# Patient Record
Sex: Female | Born: 2000 | Hispanic: No | Marital: Single | Smoking: Former smoker
Health system: Southern US, Community
[De-identification: ages and names within clinical notes are randomized; demographics above are authoritative.]

## PROBLEM LIST (undated history)

## (undated) ENCOUNTER — Inpatient Hospital Stay (HOSPITAL_COMMUNITY): Payer: Self-pay

---
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
## 2018-01-02 ENCOUNTER — Encounter (HOSPITAL_COMMUNITY): Payer: Self-pay | Admitting: Emergency Medicine

## 2018-01-02 ENCOUNTER — Ambulatory Visit (HOSPITAL_COMMUNITY)
Admission: EM | Admit: 2018-01-02 | Discharge: 2018-01-02 | Disposition: A | Discharge status: Home / Self Care | Payer: Medicaid Other | Attending: Internal Medicine | Admitting: Internal Medicine

## 2018-01-02 ENCOUNTER — Other Ambulatory Visit: Payer: Self-pay

## 2018-01-02 ENCOUNTER — Telehealth (HOSPITAL_COMMUNITY): Payer: Self-pay

## 2018-01-02 DIAGNOSIS — Z3202 Encounter for pregnancy test, result negative: Secondary | ICD-10-CM | POA: Diagnosis not present

## 2018-01-02 DIAGNOSIS — R1110 Vomiting, unspecified: Secondary | ICD-10-CM

## 2018-01-02 DIAGNOSIS — R1013 Epigastric pain: Secondary | ICD-10-CM | POA: Diagnosis not present

## 2018-01-02 DIAGNOSIS — R0789 Other chest pain: Secondary | ICD-10-CM | POA: Diagnosis not present

## 2018-01-02 LAB — COMPREHENSIVE METABOLIC PANEL
ALT: 226 U/L — ABNORMAL HIGH (ref 14–54)
AST: 220 U/L — AB (ref 15–41)
Albumin: 4.2 g/dL (ref 3.5–5.0)
Alkaline Phosphatase: 107 U/L (ref 38–126)
Anion gap: 9 (ref 5–15)
BILIRUBIN TOTAL: 1.1 mg/dL (ref 0.3–1.2)
BUN: 13 mg/dL (ref 6–20)
CO2: 30 mmol/L (ref 22–32)
Calcium: 9.8 mg/dL (ref 8.9–10.3)
Chloride: 104 mmol/L (ref 101–111)
Creatinine, Ser: 0.65 mg/dL (ref 0.44–1.00)
Glucose, Bld: 116 mg/dL — ABNORMAL HIGH (ref 65–99)
POTASSIUM: 3.7 mmol/L (ref 3.5–5.1)
Sodium: 143 mmol/L (ref 135–145)
TOTAL PROTEIN: 7.8 g/dL (ref 6.5–8.1)

## 2018-01-02 LAB — POCT URINALYSIS DIP (DEVICE)
Glucose, UA: NEGATIVE mg/dL
KETONES UR: 40 mg/dL — AB
Nitrite: NEGATIVE
PH: 5.5 (ref 5.0–8.0)
PROTEIN: 30 mg/dL — AB
Specific Gravity, Urine: 1.025 (ref 1.005–1.030)
UROBILINOGEN UA: 0.2 mg/dL (ref 0.0–1.0)

## 2018-01-02 LAB — CBC
HCT: 42.8 % (ref 36.0–46.0)
Hemoglobin: 13.9 g/dL (ref 12.0–15.0)
MCH: 29.6 pg (ref 26.0–34.0)
MCHC: 32.5 g/dL (ref 30.0–36.0)
MCV: 91.1 fL (ref 78.0–100.0)
PLATELETS: 313 10*3/uL (ref 150–400)
RBC: 4.7 MIL/uL (ref 3.87–5.11)
RDW: 14.2 % (ref 11.5–15.5)
WBC: 10.4 10*3/uL (ref 4.0–10.5)

## 2018-01-02 LAB — POCT PREGNANCY, URINE: Preg Test, Ur: NEGATIVE

## 2018-01-02 LAB — LIPASE, BLOOD: LIPASE: 29 U/L (ref 11–51)

## 2018-01-02 MED ORDER — SUCRALFATE 1 G PO TABS: 1.0000 g | ORAL_TABLET | Freq: Three times a day (TID) | ORAL | 0 refills | Status: DC

## 2018-01-02 MED ORDER — ONDANSETRON 4 MG PO TBDP: 4.0000 mg | ORAL_TABLET | Freq: Three times a day (TID) | ORAL | 0 refills | Status: DC | PRN

## 2018-01-02 MED ORDER — ONDANSETRON 4 MG PO TBDP
ORAL_TABLET | ORAL | Status: AC
  Filled 2018-01-02: qty 1

## 2018-01-02 MED ORDER — ONDANSETRON 4 MG PO TBDP
4.0000 mg | ORAL_TABLET | Freq: Once | ORAL | Status: AC
  Administered 2018-01-02: 4 mg via ORAL

## 2018-01-02 MED ORDER — OMEPRAZOLE 20 MG PO CPDR: 20.0000 mg | DELAYED_RELEASE_CAPSULE | Freq: Every day | ORAL | 0 refills | Status: DC

## 2018-01-02 MED ORDER — GI COCKTAIL ~~LOC~~
ORAL | Status: AC
  Filled 2018-01-02: qty 30

## 2018-01-02 MED ORDER — GI COCKTAIL ~~LOC~~
30.0000 mL | Freq: Once | ORAL | Status: AC
  Administered 2018-01-02: 30 mL via ORAL

## 2018-01-02 NOTE — Discharge Instructions (Signed)
Please use Zofran as needed for nausea and vomiting, please begin taking every 6-8 hours, then good as needed as symptoms improving.  Please begin diet with liquids and then transition to a blander foods like toast, crackers, rice, soups, potatoes.  Then transition to normal diet as things continue to improve.  Please continue to take Zantac, may try Prilosec as an alternative, please take either of these consistently for the next 2 weeks  Please use Carafate before meals and bedtime to also help with symptoms  We do your blood to check your liver, gallbladder, pancreas and signs of infection as causes of your symptoms.  We will call you if any of these come back abnormal.  Please drink plenty of fluids  Follow-up if abdominal pain worsening, persistent nausea vomiting, worsening chest pain or shortness of breath, fever.

## 2018-01-02 NOTE — Telephone Encounter (Signed)
Attempted to reach patient x 2 regarding results. No answer at this time. Voicemail left.  [facility] patient needs to go to the Emergency Room for further evaluation.

## 2018-01-02 NOTE — Telephone Encounter (Signed)
Attempted to reach patient regarding normal results. No answer at this time.  

## 2018-01-02 NOTE — ED Triage Notes (Signed)
Center, epigastric pain, radiates upward into chest.  Pain is sharp.  Pain started Sunday.  Patient vomited 4 times today

## 2018-01-02 NOTE — ED Provider Notes (Signed)
[facility]    CSN: [id number] Arrival date & time: 01/02/18  1358     History   Chief Complaint Chief Complaint  Patient presents with  . Abdominal Pain    HPI Jennifer Hunter is a 17 y.o. female presenting today for evaluation of epigastric pain.  Patient states that for the past 2 days she has had epigastric pain associated with vomiting.  Vomiting 4 times a day.  Pain is sharp in the center, does go up into her chest.  Denies changes in bowels or diarrhea.  Denies hematemesis.  Does endorse some mild shortness of breath as well.  The patient denies shortness of breath or URI symptoms of cough, congestion or sore throat.  Denies fevers.  Patient is currently on her menstrual cycle.  Denies dysuria, increased frequency, vaginal discharge or pelvic pain.  Patient states that the last time she had a similar sensation was when she was pregnant having reflux.  But it did not last as persistently.  She has been taking Zantac twice a day since Sunday without relief as well as trying Alka-Seltzer.  HPI  History reviewed. No pertinent past medical history.  Patient Active Problem List   Diagnosis Date Noted  . Vaginal delivery 03/06/2016    History reviewed. No pertinent surgical history.  OB History    Gravida  2   Para  1   Term  1   Preterm  0   AB  0   Living  1     SAB  0   TAB  0   Ectopic  0   Multiple      Live Births  1            Home Medications    Prior to Admission medications   Medication Sig Start Date End Date Taking? Authorizing Provider  acetaminophen (TYLENOL) 325 MG tablet Take 650 mg by mouth every 6 (six) hours as needed.    [provider]  benzonatate (TESSALON) 100 MG capsule Take 1-2 capsules (100-200 mg total) by mouth 3 (three) times daily as needed for cough. 11/01/17   Elvina SidleLauenstein, Kurt, MD  cetirizine-pseudoephedrine (ZYRTEC-D) 5-120 MG tablet Take 1 tablet by mouth daily. 11/01/17   Elvina SidleLauenstein, Kurt,  MD  diphenhydrAMINE (BENADRYL) 25 mg capsule Take 1 capsule (25 mg total) by mouth every 6 (six) hours as needed. Patient taking differently: Take 25 mg by mouth every 6 (six) hours as needed for itching or allergies.  01/28/16   Aviva SignsWilliams, Marie L, CNM  omeprazole (PRILOSEC) 20 MG capsule Take 1 capsule (20 mg total) by mouth daily. 01/02/18   Nasser Ku C, PA-C  ondansetron (ZOFRAN ODT) 4 MG disintegrating tablet Take 1 tablet (4 mg total) by mouth every 8 (eight) hours as needed for nausea or vomiting. 01/02/18   Cloteal Isaacson C, PA-C  PRESCRIPTION MEDICATION Take 1 tablet by mouth daily as needed (sleep and depression).    [provider]  sucralfate (CARAFATE) 1 g tablet Take 1 tablet (1 g total) by mouth 4 (four) times daily -  with meals and at bedtime for 14 days. 01/02/18 01/16/18  Zayvion Stailey, Junius CreamerHallie C, PA-C    Family History Family History  Problem Relation Age of Onset  . Hypertension Mother     Social History Social History   Tobacco Use  . Smoking status: Former Games developermoker  . Smokeless tobacco: Former Engineer, waterUser  Substance Use Topics  . Alcohol use: No  . Drug use: No  Allergies   Patient has no known allergies.   Review of Systems Review of Systems  Constitutional: Negative for chills, fatigue and fever.  HENT: Negative for congestion, ear pain, rhinorrhea, sinus pressure, sore throat and trouble swallowing.   Respiratory: Positive for shortness of breath. Negative for cough and chest tightness.   Cardiovascular: Positive for chest pain.  Gastrointestinal: Positive for abdominal pain, nausea and vomiting. Negative for diarrhea.  Genitourinary: Positive for vaginal bleeding. Negative for dysuria, flank pain, vaginal discharge and vaginal pain.  Musculoskeletal: Negative for back pain and myalgias.  Skin: Negative for rash.  Neurological: Negative for dizziness, light-headedness and headaches.     Physical Exam Triage Vital Signs ED Triage Vitals  Enc Vitals  Group     BP 01/02/18 1420 138/87     Pulse Rate 01/02/18 1420 65     Resp 01/02/18 1420 18     Temp 01/02/18 1420 98.2 F (36.8 C)     Temp Source 01/02/18 1420 Oral     SpO2 01/02/18 1420 98 %     Weight --      Height --      Head Circumference --      Peak Flow --      Pain Score 01/02/18 1418 10     Pain Loc --      Pain Edu? --      Excl. in GC? --    No data found.  Updated Vital Signs BP 138/87 (BP Location: Right Arm)   Pulse 65   Temp 98.2 F (36.8 C) (Oral)   Resp 18   SpO2 98%   Visual Acuity Right Eye Distance:   Left Eye Distance:   Bilateral Distance:    Right Eye Near:   Left Eye Near:    Bilateral Near:     Physical Exam  Constitutional: She is oriented to person, place, and time. She appears well-developed and well-nourished. No distress.  HENT:  Head: Normocephalic and atraumatic.  Mouth/Throat: Oropharynx is clear and moist.  Eyes: Pupils are equal, round, and reactive to light. Conjunctivae and EOM are normal.  Neck: Neck supple.  Cardiovascular: Normal rate and regular rhythm.  No murmur heard. Tenderness to palpation over inferior sternum  Pulmonary/Chest: Effort normal and breath sounds normal. No respiratory distress.  Abdominal: Soft. There is tenderness.  Tenderness to bilateral upper quadrants, and epigastrium.  Negative rebound, negative Murphy's, negative Rovsing's, negative McBurney's.  Musculoskeletal: She exhibits no edema.  Comfortably moving around to various lying, sitting positions  Neurological: She is alert and oriented to person, place, and time.  Skin: Skin is warm and dry.  Psychiatric: She has a normal mood and affect.  Nursing note and vitals reviewed.    UC Treatments / Results  Labs (all labs ordered are listed, but only abnormal results are displayed) Labs Reviewed  POCT URINALYSIS DIP (DEVICE) - Abnormal; Notable for the following components:      Result Value   Bilirubin Urine SMALL (*)    Ketones, ur 40  (*)    Hgb urine dipstick LARGE (*)    Protein, ur 30 (*)    Leukocytes, UA TRACE (*)    All other components within normal limits  CBC  COMPREHENSIVE METABOLIC PANEL  LIPASE, BLOOD  POCT PREGNANCY, URINE    EKG None  Radiology No results found.  Procedures Procedures (including critical care time)  Medications Ordered in UC Medications  ondansetron (ZOFRAN-ODT) disintegrating tablet 4 mg (4 mg Oral Given  01/02/18 1509)  gi cocktail (Maalox,Lidocaine,Donnatal) (30 mLs Oral Given 01/02/18 1510)    Initial Impression / Assessment and Plan / UC Course  I have reviewed the triage vital signs and the nursing notes.  Pertinent labs & imaging results that were available during my care of the patient were reviewed by me and considered in my medical decision making (see chart for details).     Pregnancy test negative, UA revealing signs of dehydration.  Provided patient with Zofran and GI cocktail.  She did not have any relief of abdominal pain with this.  EKG normal sinus rhythm, patient does have T wave inversions in V1 and V2-previously present in V1, but symptoms seem more GI related.  Will have patient continue to take Zantac, offered to try Prilosec as an alternative.  Advised to take consistently for 2 weeks.  Adding Carafate as pain worsens after eating.  Zofran as needed for nausea and vomiting as possible viral cause as well.  Lab work obtained including CMP, CBC and lipase to check for other intra-abdominal causes of abdominal pain.  Will call with results if abnormal.Discussed strict return precautions. Patient verbalized understanding and is agreeable with plan.  Final Clinical Impressions(s) / UC Diagnoses   Final diagnoses:  Epigastric pain     Discharge Instructions     Please use Zofran as needed for nausea and vomiting, please begin taking every 6-8 hours, then good as needed as symptoms improving.  Please begin diet with liquids and then transition to a blander  foods like toast, crackers, rice, soups, potatoes.  Then transition to normal diet as things continue to improve.  Please continue to take Zantac, may try Prilosec as an alternative, please take either of these consistently for the next 2 weeks  Please use Carafate before meals and bedtime to also help with symptoms  We do your blood to check your liver, gallbladder, pancreas and signs of infection as causes of your symptoms.  We will call you if any of these come back abnormal.  Please drink plenty of fluids  Follow-up if abdominal pain worsening, persistent nausea vomiting, worsening chest pain or shortness of breath, fever.    ED Prescriptions    Medication Sig Dispense Auth. Provider   sucralfate (CARAFATE) 1 g tablet Take 1 tablet (1 g total) by mouth 4 (four) times daily -  with meals and at bedtime for 14 days. 56 tablet Jordie Skalsky C, PA-C   omeprazole (PRILOSEC) 20 MG capsule Take 1 capsule (20 mg total) by mouth daily. 30 capsule Gissela Bloch C, PA-C   ondansetron (ZOFRAN ODT) 4 MG disintegrating tablet Take 1 tablet (4 mg total) by mouth every 8 (eight) hours as needed for nausea or vomiting. 20 tablet Ayson Cherubini, [place] C, PA-C     Controlled Substance Prescriptions [place] Controlled Substance Registry consulted? Not Applicable   Lew Dawes, [place] 01/02/18 1550

## 2018-01-03 ENCOUNTER — Telehealth (HOSPITAL_COMMUNITY): Payer: Self-pay

## 2018-01-03 NOTE — Telephone Encounter (Signed)
Pt called back. Aware of results and need to go to the ER for further evaluation.

## 2018-01-04 ENCOUNTER — Emergency Department (HOSPITAL_COMMUNITY): Payer: Medicaid Other

## 2018-01-04 ENCOUNTER — Encounter (HOSPITAL_COMMUNITY): Payer: Self-pay | Admitting: Emergency Medicine

## 2018-01-04 ENCOUNTER — Inpatient Hospital Stay (HOSPITAL_COMMUNITY)
Admission: EM | Admit: 2018-01-04 | Discharge: 2018-01-07 | DRG: 419 | Disposition: A | Discharge status: Home / Self Care | Payer: Medicaid Other | Attending: Internal Medicine | Admitting: Internal Medicine

## 2018-01-04 ENCOUNTER — Other Ambulatory Visit: Payer: Self-pay

## 2018-01-04 ENCOUNTER — Inpatient Hospital Stay (HOSPITAL_COMMUNITY): Payer: Medicaid Other

## 2018-01-04 DIAGNOSIS — Z87891 Personal history of nicotine dependence: Secondary | ICD-10-CM | POA: Diagnosis not present

## 2018-01-04 DIAGNOSIS — R739 Hyperglycemia, unspecified: Secondary | ICD-10-CM | POA: Diagnosis present

## 2018-01-04 DIAGNOSIS — K8066 Calculus of gallbladder and bile duct with acute and chronic cholecystitis without obstruction: Secondary | ICD-10-CM | POA: Diagnosis present

## 2018-01-04 DIAGNOSIS — R945 Abnormal results of liver function studies: Secondary | ICD-10-CM

## 2018-01-04 DIAGNOSIS — K812 Acute cholecystitis with chronic cholecystitis: Secondary | ICD-10-CM | POA: Diagnosis not present

## 2018-01-04 DIAGNOSIS — Z79899 Other long term (current) drug therapy: Secondary | ICD-10-CM

## 2018-01-04 DIAGNOSIS — K8050 Calculus of bile duct without cholangitis or cholecystitis without obstruction: Secondary | ICD-10-CM

## 2018-01-04 DIAGNOSIS — R7989 Other specified abnormal findings of blood chemistry: Secondary | ICD-10-CM

## 2018-01-04 DIAGNOSIS — R748 Abnormal levels of other serum enzymes: Secondary | ICD-10-CM | POA: Diagnosis present

## 2018-01-04 DIAGNOSIS — D649 Anemia, unspecified: Secondary | ICD-10-CM | POA: Diagnosis present

## 2018-01-04 LAB — COMPREHENSIVE METABOLIC PANEL
ALBUMIN: 4.4 g/dL (ref 3.5–5.0)
ALK PHOS: 138 U/L — AB (ref 38–126)
ALT: 470 U/L — AB (ref 14–54)
AST: 185 U/L — AB (ref 15–41)
Anion gap: 5 (ref 5–15)
BUN: 9 mg/dL (ref 6–20)
CALCIUM: 9.3 mg/dL (ref 8.9–10.3)
CHLORIDE: 104 mmol/L (ref 101–111)
CO2: 31 mmol/L (ref 22–32)
CREATININE: 0.58 mg/dL (ref 0.44–1.00)
GFR calc non Af Amer: >60 mL/min (ref >60)
GLUCOSE: 106 mg/dL — AB (ref 65–99)
Potassium: 3.8 mmol/L (ref 3.5–5.1)
SODIUM: 140 mmol/L (ref 135–145)
Total Bilirubin: 0.8 mg/dL (ref 0.3–1.2)
Total Protein: 7.7 g/dL (ref 6.5–8.1)

## 2018-01-04 LAB — URINALYSIS, ROUTINE W REFLEX MICROSCOPIC
Bilirubin Urine: NEGATIVE
GLUCOSE, UA: NEGATIVE mg/dL
Ketones, ur: NEGATIVE mg/dL
Leukocytes, UA: NEGATIVE
Nitrite: NEGATIVE
PROTEIN: NEGATIVE mg/dL
SPECIFIC GRAVITY, URINE: 1.006 (ref 1.005–1.030)
pH: 9 — ABNORMAL HIGH (ref 5.0–8.0)

## 2018-01-04 LAB — CBC
HCT: 40.3 % (ref 36.0–46.0)
Hemoglobin: 13.4 g/dL (ref 12.0–15.0)
MCH: 30.6 pg (ref 26.0–34.0)
MCHC: 33.3 g/dL (ref 30.0–36.0)
MCV: 92 fL (ref 78.0–100.0)
PLATELETS: 289 10*3/uL (ref 150–400)
RBC: 4.38 MIL/uL (ref 3.87–5.11)
RDW: 14.6 % (ref 11.5–15.5)
WBC: 5.3 10*3/uL (ref 4.0–10.5)

## 2018-01-04 LAB — I-STAT BETA HCG BLOOD, ED (MC, WL, AP ONLY): I-stat hCG, quantitative: <5 m[IU]/mL (ref <5)

## 2018-01-04 LAB — LIPASE, BLOOD: LIPASE: 29 U/L (ref 11–51)

## 2018-01-04 MED ORDER — OXYCODONE HCL 5 MG PO TABS
5.0000 mg | ORAL_TABLET | ORAL | Status: DC | PRN
  Administered 2018-01-06 – 2018-01-07 (×5): 5 mg via ORAL
  Filled 2018-01-04 (×5): qty 1

## 2018-01-04 MED ORDER — SODIUM CHLORIDE 0.9 % IV BOLUS
1000.0000 mL | Freq: Once | INTRAVENOUS | Status: AC
  Administered 2018-01-04: 1000 mL via INTRAVENOUS

## 2018-01-04 MED ORDER — SODIUM CHLORIDE 0.9 % IV SOLN
INTRAVENOUS | Status: DC
  Administered 2018-01-04 – 2018-01-07 (×4): via INTRAVENOUS

## 2018-01-04 MED ORDER — GADOBENATE DIMEGLUMINE 529 MG/ML IV SOLN
15.0000 mL | Freq: Once | INTRAVENOUS | Status: AC | PRN
  Administered 2018-01-04: 12 mL via INTRAVENOUS

## 2018-01-04 MED ORDER — ACETAMINOPHEN 650 MG RE SUPP: 650.0000 mg | Freq: Four times a day (QID) | RECTAL | Status: DC | PRN

## 2018-01-04 MED ORDER — ONDANSETRON HCL 4 MG PO TABS: 4.0000 mg | ORAL_TABLET | Freq: Four times a day (QID) | ORAL | Status: DC | PRN

## 2018-01-04 MED ORDER — ACETAMINOPHEN 325 MG PO TABS: 650.0000 mg | ORAL_TABLET | Freq: Four times a day (QID) | ORAL | Status: DC | PRN

## 2018-01-04 MED ORDER — ENOXAPARIN SODIUM 40 MG/0.4ML ~~LOC~~ SOLN
40.0000 mg | SUBCUTANEOUS | Status: DC
  Administered 2018-01-04: 40 mg via SUBCUTANEOUS
  Filled 2018-01-04 (×3): qty 0.4

## 2018-01-04 MED ORDER — ONDANSETRON HCL 4 MG/2ML IJ SOLN: 4.0000 mg | Freq: Four times a day (QID) | INTRAMUSCULAR | Status: DC | PRN

## 2018-01-04 NOTE — ED Notes (Signed)
ED TO INPATIENT HANDOFF REPORT  Name/Age/Gender Jennifer Hunter 17 y.o. female  Code Status Code Status History    Date Active Date Inactive Code Status Order ID Comments User Context   03/04/2016 0906 03/06/2016 1830 Full Code [id number]  Jamal Maes Inpatient   03/03/2016 1555 03/04/2016 6503 Full Code [id number]  Bufford Lope, DO Inpatient      Home/SNF/Other Home  Chief Complaint abd pain   Level of Care/Admitting Diagnosis ED Disposition    ED Disposition Condition [place] [facility]: [facility] [[id number]]  Level of Care: Med-Surg [16]  Diagnosis: Choledocholithiasis [749449]  Admitting Physician: Dessa Phi [[id number]]  Attending Physician: Dessa Phi [phone]  Estimated length of stay: past midnight tomorrow  Certification:: I certify this patient will need inpatient services for at least 2 midnights  PT Class (Do Not Modify): Inpatient [101]  PT Acc Code (Do Not Modify): Private [1]       Medical History History reviewed. No pertinent past medical history.  Allergies No Known Allergies  IV Location/Drains/Wounds Patient Lines/Drains/Airways Status   Active Line/Drains/Airways    Name:   Placement date:   Placement time:   Site:   Days:   Peripheral IV 01/04/18 Left Antecubital   01/04/18    1347    Antecubital   less than 1          Labs/Imaging Results for orders placed or performed during the hospital encounter of 01/04/18 (from the past 48 hour(s))  Lipase, blood     Status: None   Collection Time: 01/04/18 12:10 PM  Result Value Ref Range   Lipase 29 11 - 51 U/L    Comment: Performed at [facility], [place] [address] [place], [place] [address]  Comprehensive metabolic panel     Status: Abnormal   Collection Time: 01/04/18 12:10 PM  Result Value Ref Range   Sodium 140 135 - 145 mmol/L   Potassium 3.8 3.5 - 5.1 mmol/L   Chloride 104 101 - 111 mmol/L   CO2 31 22 - 32  mmol/L   Glucose, Bld 106 (H) 65 - 99 mg/dL   BUN 9 6 - 20 mg/dL   Creatinine, Ser 0.58 0.44 - 1.00 mg/dL   Calcium 9.3 8.9 - 10.3 mg/dL   Total Protein 7.7 6.5 - 8.1 g/dL   Albumin 4.4 3.5 - 5.0 g/dL   AST 185 (H) 15 - 41 U/L   ALT 470 (H) 14 - 54 U/L   Alkaline Phosphatase 138 (H) 38 - 126 U/L   Total Bilirubin 0.8 0.3 - 1.2 mg/dL   GFR calc non Af Amer >60 >60 mL/min   GFR calc Af Amer >60 >60 mL/min    Comment: (NOTE) The eGFR has been calculated using the CKD EPI equation. This calculation has not been validated in all clinical situations. eGFR's persistently <60 mL/min signify possible Chronic Kidney Disease.    Anion gap 5 5 - 15    Comment: Performed at [facility], [place] [address] [place], [place] [address]  CBC     Status: None   Collection Time: 01/04/18 12:10 PM  Result Value Ref Range   WBC 5.3 4.0 - 10.5 K/uL   RBC 4.38 3.87 - 5.11 MIL/uL   Hemoglobin 13.4 12.0 - 15.0 g/dL   HCT 40.3 36.0 - 46.0 %   MCV 92.0 78.0 - 100.0 fL   MCH 30.6 26.0 - 34.0 pg   MCHC 33.3  30.0 - 36.0 g/dL   RDW 14.6 11.5 - 15.5 %   Platelets 289 150 - 400 K/uL    Comment: Performed at [facility], [place] [address], [place], [place] [address]  I-Stat beta hCG blood, ED     Status: None   Collection Time: 01/04/18 12:15 PM  Result Value Ref Range   I-stat hCG, quantitative <5.0 <5 mIU/mL   Comment 3            Comment:   GEST. AGE      CONC.  (mIU/mL)   <=1 WEEK        5 - 50     2 WEEKS       50 - 500     3 WEEKS       100 - 10,000     4 WEEKS     1,000 - 30,000        FEMALE AND NON-PREGNANT FEMALE:     LESS THAN 5 mIU/mL   Urinalysis, Routine w reflex microscopic     Status: Abnormal   Collection Time: 01/04/18  1:14 PM  Result Value Ref Range   Color, Urine STRAW (A) YELLOW   APPearance CLEAR CLEAR   Specific Gravity, Urine 1.006 1.005 - 1.030   pH 9.0 (H) 5.0 - 8.0   Glucose, UA NEGATIVE NEGATIVE mg/dL   Hgb urine dipstick SMALL (A)  NEGATIVE   Bilirubin Urine NEGATIVE NEGATIVE   Ketones, ur NEGATIVE NEGATIVE mg/dL   Protein, ur NEGATIVE NEGATIVE mg/dL   Nitrite NEGATIVE NEGATIVE   Leukocytes, UA NEGATIVE NEGATIVE   RBC / HPF 0-5 0 - 5 RBC/hpf   WBC, UA 0-5 0 - 5 WBC/hpf   Bacteria, UA RARE (A) NONE SEEN   Squamous Epithelial / LPF 0-5 0 - 5    Comment: Performed at [facility], [place] [address], [place], [place] [address]   US Abdomen Limited Ruq  Result Date: 01/04/2018 CLINICAL DATA:  Acute right upper quadrant abdominal pain. EXAM: ULTRASOUND ABDOMEN LIMITED RIGHT UPPER QUADRANT COMPARISON:  None. FINDINGS: Gallbladder: Large gallstone measuring 1.9 cm is noted. No gallbladder wall thickening or pericholecystic fluid is noted. No sonographic Murphy's sign is noted. Sludge is noted within gallbladder lumen is well. Common bile duct: Diameter: Dilated at 9 mm. Probable large stone is noted in distal common bile duct. Liver: No focal lesion identified. Within normal limits in parenchymal echogenicity. Portal vein is patent on color Doppler imaging with normal direction of blood flow towards the liver. IMPRESSION: Cholelithiasis is noted without evidence of cholecystitis. Common bile duct dilatation is noted due to stone or stones in distal common bile duct consistent with choledocholithiasis. Further evaluation with MRCP or CT scan is recommended. Electronically Signed   By: Marijo Conception, M.D.   On: 01/04/2018 14:36    Pending Labs Unresulted Labs (From admission, onward)   None      Vitals/Pain Today's Vitals   01/04/18 1204 01/04/18 1205 01/04/18 1315 01/04/18 1531  BP: 121/72  117/76 137/80  Pulse: 86  62 66  Resp: _0 Temp: 98 F (36.7 C)  98.3 F (36.8 C)   TempSrc: Oral  Oral   SpO2: 99%  100% 100%  PainSc:  2       Isolation Precautions No active isolations  Medications Medications  sodium chloride 0.9 % bolus 1,000 mL (0 mLs Intravenous Stopped 01/04/18 1619)     Mobility walks

## 2018-01-04 NOTE — H&P (Signed)
History and Physical    Jennifer Hunter:[id number] DOB: 2000/07/21 DOA: 01/04/2018  PCP: Patient, No Pcp Per  Patient coming from: Home  Chief Complaint: Right upper quadrant abdominal pain since Sunday  HPI: Jennifer Hunter is a 17 y.o. female with no previous significant medical history who presents with 5-day history of right upper quadrant abdominal pain.  She states that eating makes it worse and describes it as a sharp pain that has been constant in nature.  She admits to poor p.o. intake, nausea and vomiting as well.  Denies any fevers at home, chest pain, shortness of breath, cough, diarrhea, dysuria.  She notes that she has had this previously during pregnancy which resolved without intervention.  ED Course: Labs obtained which revealed elevated liver enzyme AST 220, ALT 226.  Right upper quadrant ultrasound revealed choledocholithiasis with dilated common bile duct 9 mm.  Review of Systems: As per HPI otherwise 10 point review of systems negative.   History reviewed. No pertinent past medical history.  History reviewed. No pertinent surgical history.   reports that she has quit smoking. She has quit using smokeless tobacco. She reports that she does not drink alcohol or use drugs.  No Known Allergies  Family History  Problem Relation Age of Onset  . Hypertension Mother     Prior to Admission medications   Medication Sig Start Date End Date Taking? Authorizing Provider  omeprazole (PRILOSEC) 20 MG capsule Take 1 capsule (20 mg total) by mouth daily. 01/02/18  Yes Wieters, Hallie C, PA-C  ondansetron (ZOFRAN ODT) 4 MG disintegrating tablet Take 1 tablet (4 mg total) by mouth every 8 (eight) hours as needed for nausea or vomiting. 01/02/18  Yes Wieters, Hallie C, PA-C  PRESCRIPTION MEDICATION 1 Units. Explanon implant   Yes [provider]  sucralfate (CARAFATE) 1 g tablet Take 1 tablet (1 g total) by mouth 4 (four) times daily -  with meals and at  bedtime for 14 days. 01/02/18 01/16/18 Yes Wieters, Hallie C, PA-C  benzonatate (TESSALON) 100 MG capsule Take 1-2 capsules (100-200 mg total) by mouth 3 (three) times daily as needed for cough. Patient not taking: Reported on 01/04/2018 11/01/17   Elvina Sidle, MD  cetirizine-pseudoephedrine (ZYRTEC-D) 5-120 MG tablet Take 1 tablet by mouth daily. Patient not taking: Reported on 01/04/2018 11/01/17   Elvina Sidle, MD  diphenhydrAMINE (BENADRYL) 25 mg capsule Take 1 capsule (25 mg total) by mouth every 6 (six) hours as needed. Patient not taking: Reported on 01/04/2018 01/28/16   Aviva Signs, CNM    Physical Exam: Vitals:   01/04/18 1204 01/04/18 1315 01/04/18 1531  BP: 121/72 117/76 137/80  Pulse: 86 62 66  Resp: 18 12 16   Temp: 98 F (36.7 C) 98.3 F (36.8 C)   TempSrc: Oral Oral   SpO2: 99% 100% 100%    Constitutional: NAD, calm, comfortable Eyes: PERRL, lids and conjunctivae normal ENMT: Mucous membranes are moist. Posterior pharynx clear of any exudate or lesions.Normal dentition.  Neck: normal, supple, no masses, no thyromegaly Respiratory: clear to auscultation bilaterally, no wheezing, no crackles. Normal respiratory effort. No accessory muscle use.  Cardiovascular: Regular rate and rhythm, no murmurs / rubs / gallops. No extremity edema.  Abdomen: Tenderness to palpation right upper quadrant, without guarding or rebound.  Abdomen is soft.  Bowel sounds present. Musculoskeletal: no clubbing / cyanosis. No joint deformity upper and lower extremities. Good ROM, no contractures. Normal muscle tone.  Skin: no rashes, lesions, ulcers. No induration Neurologic:  CN 2-12 grossly intact. Strength 5/5 in all 4.  Psychiatric: Normal judgment and insight. Alert and oriented x 3. Normal mood.   Labs on Admission: I have personally reviewed following labs and imaging studies  CBC: Recent Labs  Lab 01/02/18 1538 01/04/18 1210  WBC 10.4 5.3  HGB 13.9 13.4  HCT 42.8 40.3  MCV  91.1 92.0  PLT 313 289   Basic Metabolic Panel: Recent Labs  Lab 01/02/18 1538 01/04/18 1210  NA 143 140  K 3.7 3.8  CL 104 104  CO2 30 31  GLUCOSE 116* 106*  BUN 13 9  CREATININE 0.65 0.58  CALCIUM 9.8 9.3   GFR: CrCl cannot be calculated (Unknown ideal weight.). Liver Function Tests: Recent Labs  Lab 01/02/18 1538 01/04/18 1210  AST 220* 185*  ALT 226* 470*  ALKPHOS 107 138*  BILITOT 1.1 0.8  PROT 7.8 7.7  ALBUMIN 4.2 4.4   Recent Labs  Lab 01/02/18 1538 01/04/18 1210  LIPASE 29 29   No results for input(s): AMMONIA in the last 168 hours. Coagulation Profile: No results for input(s): INR, PROTIME in the last 168 hours. Cardiac Enzymes: No results for input(s): CKTOTAL, CKMB, CKMBINDEX, TROPONINI in the last 168 hours. BNP (last 3 results) No results for input(s): PROBNP in the last 8760 hours. HbA1C: No results for input(s): HGBA1C in the last 72 hours. CBG: No results for input(s): GLUCAP in the last 168 hours. Lipid Profile: No results for input(s): CHOL, HDL, LDLCALC, TRIG, CHOLHDL, LDLDIRECT in the last 72 hours. Thyroid Function Tests: No results for input(s): TSH, T4TOTAL, FREET4, T3FREE, THYROIDAB in the last 72 hours. Anemia Panel: No results for input(s): VITAMINB12, FOLATE, FERRITIN, TIBC, IRON, RETICCTPCT in the last 72 hours. Urine analysis:    Component Value Date/Time   COLORURINE STRAW (A) 01/04/2018 1314   APPEARANCEUR CLEAR 01/04/2018 1314   LABSPEC 1.006 01/04/2018 1314   PHURINE 9.0 (H) 01/04/2018 1314   GLUCOSEU NEGATIVE 01/04/2018 1314   HGBUR SMALL (A) 01/04/2018 1314   BILIRUBINUR NEGATIVE 01/04/2018 1314   KETONESUR NEGATIVE 01/04/2018 1314   PROTEINUR NEGATIVE 01/04/2018 1314   UROBILINOGEN 0.2 01/02/2018 1424   NITRITE NEGATIVE 01/04/2018 1314   LEUKOCYTESUR NEGATIVE 01/04/2018 1314   Sepsis Labs: !!!!!!!!!!!!!!!!!!!!!!!!!!!!!!!!!!!!!!!!!!!! @LABRCNTIP (procalcitonin:4,lacticidven:4) )No results found for this or any  previous visit (from the past 240 hour(s)).   Radiological Exams on Admission: US Abdomen Limited Ruq  Result Date: 01/04/2018 CLINICAL DATA:  Acute right upper quadrant abdominal pain. EXAM: ULTRASOUND ABDOMEN LIMITED RIGHT UPPER QUADRANT COMPARISON:  None. FINDINGS: Gallbladder: Large gallstone measuring 1.9 cm is noted. No gallbladder wall thickening or pericholecystic fluid is noted. No sonographic Murphy's sign is noted. Sludge is noted within gallbladder lumen is well. Common bile duct: Diameter: Dilated at 9 mm. Probable large stone is noted in distal common bile duct. Liver: No focal lesion identified. Within normal limits in parenchymal echogenicity. Portal vein is patent on color Doppler imaging with normal direction of blood flow towards the liver. IMPRESSION: Cholelithiasis is noted without evidence of cholecystitis. Common bile duct dilatation is noted due to stone or stones in distal common bile duct consistent with choledocholithiasis. Further evaluation with MRCP or CT scan is recommended. Electronically Signed   By: Lupita Raider, M.D.   On: 01/04/2018 14:36    Assessment/Plan Principal Problem:   Choledocholithiasis Active Problems:   Elevated LFTs   Choledocholithiasis  -[place] RUQ: Cholelithiasis is noted without evidence of cholecystitis. Common bile duct dilatation 9mm  -Check MRCP -  GI consulted  Elevated liver enzymes -Trend    DVT prophylaxis: Lovenox Code Status: Full  Family Communication: No family at bedside Disposition Plan: Pending work up [employer] called: [facility] consulted by EDP   Admission status: Inpatient    * I certify that at the point of admission it is my clinical judgment that the patient will require inpatient hospital care spanning beyond 2 midnights from the point of admission due to high intensity of service, high risk for further deterioration and high frequency of surveillance required.*   Noralee Stain, DO [facility] [email or web address] Password [facility] 01/04/2018, 4:47 PM

## 2018-01-04 NOTE — ED Triage Notes (Signed)
Patient here from home with complaints of mid upper epigastric pain, started a few days ago. Nausea, vomiting. Seen at urgent care stated LTFs were elevated, informed to come here for further evaluation.

## 2018-01-04 NOTE — ED Provider Notes (Signed)
[facility]-EMERGENCY DEPT Provider Note   CSN: [id number] Arrival date & time: 01/04/18  1148     History   Chief Complaint Chief Complaint  Patient presents with  . Abnormal Lab    HPI Jennifer Hunter is a 17 y.o. female.  HPI  17 year old female presents with epigastric abdominal pain and abnormal LFTs.  For 4 days she is been having upper abdominal pain.  It is better now but still about a 5 out of 10.  She is been having some vomiting earlier but this is resolved.  No diarrhea or urinary symptoms.  No fevers.  Sometimes the pain goes up into her chest.  The pain is at its worst about 20 minutes after eating.  She went to urgent care 2 days ago and had some abnormal LFTs and was called and told to go to the ER.  She was given Carafate, Prilosec, and ondansetron with mild relief by urgent care.  History reviewed. No pertinent past medical history.  Patient Active Problem List   Diagnosis Date Noted  . Choledocholithiasis 01/04/2018  . Vaginal delivery 03/06/2016    History reviewed. No pertinent surgical history.   OB History    Gravida  2   Para  1   Term  1   Preterm  0   AB  0   Living  1     SAB  0   TAB  0   Ectopic  0   Multiple      Live Births  1            Home Medications    Prior to Admission medications   Medication Sig Start Date End Date Taking? Authorizing Provider  omeprazole (PRILOSEC) 20 MG capsule Take 1 capsule (20 mg total) by mouth daily. 01/02/18  Yes Wieters, Hallie C, PA-C  ondansetron (ZOFRAN ODT) 4 MG disintegrating tablet Take 1 tablet (4 mg total) by mouth every 8 (eight) hours as needed for nausea or vomiting. 01/02/18  Yes Wieters, Hallie C, PA-C  PRESCRIPTION MEDICATION 1 Units. Explanon implant   Yes [provider]  sucralfate (CARAFATE) 1 g tablet Take 1 tablet (1 g total) by mouth 4 (four) times daily -  with meals and at bedtime for 14 days. 01/02/18 01/16/18 Yes Wieters, Hallie  C, PA-C  benzonatate (TESSALON) 100 MG capsule Take 1-2 capsules (100-200 mg total) by mouth 3 (three) times daily as needed for cough. Patient not taking: Reported on 01/04/2018 11/01/17   Elvina SidleLauenstein, Kurt, MD  cetirizine-pseudoephedrine (ZYRTEC-D) 5-120 MG tablet Take 1 tablet by mouth daily. Patient not taking: Reported on 01/04/2018 11/01/17   Elvina SidleLauenstein, Kurt, MD  diphenhydrAMINE (BENADRYL) 25 mg capsule Take 1 capsule (25 mg total) by mouth every 6 (six) hours as needed. Patient not taking: Reported on 01/04/2018 01/28/16   Aviva SignsWilliams, Marie L, CNM    Family History Family History  Problem Relation Age of Onset  . Hypertension Mother     Social History Social History   Tobacco Use  . Smoking status: Former Games developermoker  . Smokeless tobacco: Former Engineer, waterUser  Substance Use Topics  . Alcohol use: No  . Drug use: No     Allergies   Patient has no known allergies.   Review of Systems Review of Systems  Constitutional: Negative for fever.  Respiratory: Negative for shortness of breath.   Gastrointestinal: Positive for abdominal pain, nausea and vomiting.  Genitourinary: Negative for dysuria.  Musculoskeletal: Negative for back pain.  All other systems reviewed and are negative.    Physical Exam Updated Vital Signs BP 117/76 (BP Location: Right Arm)   Pulse 62   Temp 98.3 F (36.8 C) (Oral)   Resp 12   SpO2 100%   Physical Exam  Constitutional: She is oriented to person, place, and time. She appears well-developed and well-nourished. No distress.  HENT:  Head: Normocephalic and atraumatic.  Right Ear: External ear normal.  Left Ear: External ear normal.  Nose: Nose normal.  Eyes: Right eye exhibits no discharge. Left eye exhibits no discharge.  Cardiovascular: Normal rate, regular rhythm and normal heart sounds.  Pulmonary/Chest: Effort normal and breath sounds normal.  Abdominal: Soft. There is tenderness in the right upper quadrant and epigastric area. There is negative  Murphy's sign.  Neurological: She is alert and oriented to person, place, and time.  Skin: Skin is warm and dry. She is not diaphoretic.  Nursing note and vitals reviewed.    ED Treatments / Results  Labs (all labs ordered are listed, but only abnormal results are displayed) Labs Reviewed  COMPREHENSIVE METABOLIC PANEL - Abnormal; Notable for the following components:      Result Value   Glucose, Bld 106 (*)    AST 185 (*)    ALT 470 (*)    Alkaline Phosphatase 138 (*)    All other components within normal limits  URINALYSIS, ROUTINE W REFLEX MICROSCOPIC - Abnormal; Notable for the following components:   Color, Urine STRAW (*)    pH 9.0 (*)    Hgb urine dipstick SMALL (*)    Bacteria, UA RARE (*)    All other components within normal limits  LIPASE, BLOOD  CBC  I-STAT BETA HCG BLOOD, ED ([facility], [facility], [facility] ONLY)    EKG None  Radiology US Abdomen Limited Ruq  Result Date: 01/04/2018 CLINICAL DATA:  Acute right upper quadrant abdominal pain. EXAM: ULTRASOUND ABDOMEN LIMITED RIGHT UPPER QUADRANT COMPARISON:  None. FINDINGS: Gallbladder: Large gallstone measuring 1.9 cm is noted. No gallbladder wall thickening or pericholecystic fluid is noted. No sonographic Murphy's sign is noted. Sludge is noted within gallbladder lumen is well. Common bile duct: Diameter: Dilated at 9 mm. Probable large stone is noted in distal common bile duct. Liver: No focal lesion identified. Within normal limits in parenchymal echogenicity. Portal vein is patent on color Doppler imaging with normal direction of blood flow towards the liver. IMPRESSION: Cholelithiasis is noted without evidence of cholecystitis. Common bile duct dilatation is noted due to stone or stones in distal common bile duct consistent with choledocholithiasis. Further evaluation with MRCP or CT scan is recommended. Electronically Signed   By: Lupita Raider, M.D.   On: 01/04/2018 14:36    Procedures Procedures (including critical care  time)  Medications Ordered in ED Medications  sodium chloride 0.9 % bolus 1,000 mL (1,000 mLs Intravenous New Bag/Given 01/04/18 1348)     Initial Impression / Assessment and Plan / ED Course  I have reviewed the triage vital signs and the nursing notes.  Pertinent labs & imaging results that were available during my care of the patient were reviewed by me and considered in my medical decision making (see chart for details).     Ultrasound concerning for choledocholithiasis.  I discussed with GI, who will consult and recommends MRCP.  Hospitalist to admit.  She is not febrile or currently vomiting.  She declines pain medicine.  Final Clinical Impressions(s) / ED Diagnoses   Final diagnoses:  Abnormal  LFTs  Choledocholithiasis    ED Discharge Orders    None       Pricilla Loveless, MD 01/04/18 1534

## 2018-01-05 ENCOUNTER — Encounter (HOSPITAL_COMMUNITY): Payer: Self-pay

## 2018-01-05 ENCOUNTER — Inpatient Hospital Stay (HOSPITAL_COMMUNITY): Payer: Medicaid Other | Admitting: Anesthesiology

## 2018-01-05 ENCOUNTER — Inpatient Hospital Stay (HOSPITAL_COMMUNITY): Payer: Medicaid Other

## 2018-01-05 ENCOUNTER — Encounter (HOSPITAL_COMMUNITY)
Admission: EM | Disposition: A | Discharge status: Home / Self Care | Payer: Self-pay | Source: Home / Self Care | Attending: Internal Medicine

## 2018-01-05 DIAGNOSIS — K8050 Calculus of bile duct without cholangitis or cholecystitis without obstruction: Secondary | ICD-10-CM

## 2018-01-05 DIAGNOSIS — R945 Abnormal results of liver function studies: Secondary | ICD-10-CM

## 2018-01-05 DIAGNOSIS — R739 Hyperglycemia, unspecified: Secondary | ICD-10-CM

## 2018-01-05 HISTORY — PX: ERCP: SHX5425

## 2018-01-05 HISTORY — PX: SPHINCTEROTOMY: SHX5544

## 2018-01-05 HISTORY — PX: REMOVAL OF STONES: SHX5545

## 2018-01-05 LAB — COMPREHENSIVE METABOLIC PANEL
ALBUMIN: 3.8 g/dL (ref 3.5–5.0)
ALK PHOS: 114 U/L (ref 38–126)
ALT: 307 U/L — ABNORMAL HIGH (ref 14–54)
ANION GAP: 5 (ref 5–15)
AST: 85 U/L — ABNORMAL HIGH (ref 15–41)
BUN: 9 mg/dL (ref 6–20)
CALCIUM: 9.1 mg/dL (ref 8.9–10.3)
CHLORIDE: 108 mmol/L (ref 101–111)
CO2: 28 mmol/L (ref 22–32)
Creatinine, Ser: 0.61 mg/dL (ref 0.44–1.00)
GFR calc Af Amer: >60 mL/min (ref >60)
GFR calc non Af Amer: >60 mL/min (ref >60)
GLUCOSE: 83 mg/dL (ref 65–99)
Potassium: 4.2 mmol/L (ref 3.5–5.1)
SODIUM: 141 mmol/L (ref 135–145)
Total Bilirubin: 0.9 mg/dL (ref 0.3–1.2)
Total Protein: 6.8 g/dL (ref 6.5–8.1)

## 2018-01-05 LAB — HIV ANTIBODY (ROUTINE TESTING W REFLEX): HIV SCREEN 4TH GENERATION: NONREACTIVE

## 2018-01-05 LAB — CBC
HCT: 38.6 % (ref 36.0–46.0)
Hemoglobin: 12.8 g/dL (ref 12.0–15.0)
MCH: 30 pg (ref 26.0–34.0)
MCHC: 33.2 g/dL (ref 30.0–36.0)
MCV: 90.6 fL (ref 78.0–100.0)
PLATELETS: 254 10*3/uL (ref 150–400)
RBC: 4.26 MIL/uL (ref 3.87–5.11)
RDW: 14.4 % (ref 11.5–15.5)
WBC: 4.4 10*3/uL (ref 4.0–10.5)

## 2018-01-05 SURGERY — ERCP, WITH INTERVENTION IF INDICATED
Anesthesia: General

## 2018-01-05 MED ORDER — CEFAZOLIN SODIUM-DEXTROSE 2-4 GM/100ML-% IV SOLN
2.0000 g | INTRAVENOUS | Status: AC
  Administered 2018-01-06: 2 g via INTRAVENOUS
  Filled 2018-01-05: qty 100

## 2018-01-05 MED ORDER — SCOPOLAMINE 1 MG/3DAYS TD PT72
MEDICATED_PATCH | TRANSDERMAL | Status: AC
  Filled 2018-01-05: qty 1

## 2018-01-05 MED ORDER — PROPOFOL 10 MG/ML IV BOLUS
INTRAVENOUS | Status: AC
  Filled 2018-01-05: qty 20

## 2018-01-05 MED ORDER — INDOMETHACIN 50 MG RE SUPP
RECTAL | Status: AC
  Filled 2018-01-05: qty 2

## 2018-01-05 MED ORDER — SODIUM CHLORIDE 0.9 % IV SOLN
INTRAVENOUS | Status: DC | PRN
  Administered 2018-01-05: 30 mL

## 2018-01-05 MED ORDER — GLUCAGON HCL RDNA (DIAGNOSTIC) 1 MG IJ SOLR
INTRAMUSCULAR | Status: AC
  Filled 2018-01-05: qty 1

## 2018-01-05 MED ORDER — HYDROMORPHONE HCL 1 MG/ML IJ SOLN
INTRAMUSCULAR | Status: AC
  Filled 2018-01-05: qty 1

## 2018-01-05 MED ORDER — SODIUM CHLORIDE 0.9 % IV SOLN: INTRAVENOUS | Status: DC

## 2018-01-05 MED ORDER — EPHEDRINE SULFATE-NACL 50-0.9 MG/10ML-% IV SOSY
PREFILLED_SYRINGE | INTRAVENOUS | Status: DC | PRN
  Administered 2018-01-05: 10 mg via INTRAVENOUS

## 2018-01-05 MED ORDER — LACTATED RINGERS IV BOLUS
1000.0000 mL | Freq: Once | INTRAVENOUS | Status: AC
  Administered 2018-01-05: 1000 mL via INTRAVENOUS

## 2018-01-05 MED ORDER — SUGAMMADEX SODIUM 200 MG/2ML IV SOLN
INTRAVENOUS | Status: DC | PRN
  Administered 2018-01-05: 200 mg via INTRAVENOUS

## 2018-01-05 MED ORDER — HYDROMORPHONE HCL 1 MG/ML IJ SOLN: 1.0000 mg | INTRAMUSCULAR | Status: DC | PRN

## 2018-01-05 MED ORDER — HYDROMORPHONE HCL 1 MG/ML IJ SOLN
1.0000 mg | INTRAMUSCULAR | Status: DC | PRN
  Administered 2018-01-05: 1 mg via INTRAVENOUS

## 2018-01-05 MED ORDER — FENTANYL CITRATE (PF) 100 MCG/2ML IJ SOLN
INTRAMUSCULAR | Status: AC
  Filled 2018-01-05: qty 2

## 2018-01-05 MED ORDER — CIPROFLOXACIN IN D5W 400 MG/200ML IV SOLN
INTRAVENOUS | Status: AC
  Filled 2018-01-05: qty 200

## 2018-01-05 MED ORDER — SCOPOLAMINE 1 MG/3DAYS TD PT72
MEDICATED_PATCH | TRANSDERMAL | Status: DC | PRN
  Administered 2018-01-05: 1 via TRANSDERMAL

## 2018-01-05 MED ORDER — PROPOFOL 10 MG/ML IV BOLUS
INTRAVENOUS | Status: DC | PRN
  Administered 2018-01-05: 150 mg via INTRAVENOUS

## 2018-01-05 MED ORDER — LIDOCAINE 2% (20 MG/ML) 5 ML SYRINGE
INTRAMUSCULAR | Status: DC | PRN
  Administered 2018-01-05: 100 mg via INTRAVENOUS

## 2018-01-05 MED ORDER — LACTATED RINGERS IV SOLN
INTRAVENOUS | Status: DC
  Administered 2018-01-05: 1000 mL via INTRAVENOUS

## 2018-01-05 MED ORDER — PHENYLEPHRINE 40 MCG/ML (10ML) SYRINGE FOR IV PUSH (FOR BLOOD PRESSURE SUPPORT)
PREFILLED_SYRINGE | INTRAVENOUS | Status: DC | PRN
  Administered 2018-01-05 (×2): 80 ug via INTRAVENOUS
  Administered 2018-01-05: 160 ug via INTRAVENOUS
  Administered 2018-01-05: 80 ug via INTRAVENOUS

## 2018-01-05 MED ORDER — MENTHOL 3 MG MT LOZG
1.0000 | LOZENGE | Freq: Once | OROMUCOSAL | Status: AC
  Administered 2018-01-05: 3 mg via ORAL
  Filled 2018-01-05: qty 9

## 2018-01-05 MED ORDER — DEXAMETHASONE SODIUM PHOSPHATE 10 MG/ML IJ SOLN
INTRAMUSCULAR | Status: DC | PRN
  Administered 2018-01-05: 10 mg via INTRAVENOUS

## 2018-01-05 MED ORDER — CIPROFLOXACIN IN D5W 400 MG/200ML IV SOLN
INTRAVENOUS | Status: DC | PRN
  Administered 2018-01-05: 400 mg via INTRAVENOUS

## 2018-01-05 MED ORDER — ONDANSETRON HCL 4 MG/2ML IJ SOLN
INTRAMUSCULAR | Status: DC | PRN
  Administered 2018-01-05: 4 mg via INTRAVENOUS

## 2018-01-05 MED ORDER — ROCURONIUM BROMIDE 10 MG/ML (PF) SYRINGE
PREFILLED_SYRINGE | INTRAVENOUS | Status: DC | PRN
  Administered 2018-01-05: 30 mg via INTRAVENOUS

## 2018-01-05 MED ORDER — FENTANYL CITRATE (PF) 100 MCG/2ML IJ SOLN
INTRAMUSCULAR | Status: DC | PRN
  Administered 2018-01-05 (×2): 50 ug via INTRAVENOUS

## 2018-01-05 MED ORDER — LACTATED RINGERS IV BOLUS: 1000.0000 mL | Freq: Once | INTRAVENOUS | Status: DC

## 2018-01-05 NOTE — Consult Note (Signed)
[name] Consult/Admission Note  Jennifer Hunter 08/28/2000  [id number].    Requesting MD: Dr. Therisa Doyne Chief Complaint/Reason for Consult: choledocholithiasis  HPI:   Pt is a healthy 17 yo female who was admitted for abdominal pain and elevated LFT's. Pt was found to have a 1.9cm gallstone and choledocholithiasis. Pt underwent ERCP today. Pt states she had RUQ pain for 5 days before admission. She had similar pain when she was pregnant but this is the first time she has had this pain since giving birth in Aug 2017. Pain was in the RUQ, radiating into her chest, severe at times with associated nausea and vomiting. She currently has mild non radiating pain relieved with pain medication. No nausea.   ROS:  Review of Systems  Constitutional: Negative for chills, diaphoresis and fever.  HENT: Negative for sore throat.   Respiratory: Negative for cough and shortness of breath.   Cardiovascular: Negative for chest pain.  Gastrointestinal: Positive for abdominal pain. Negative for blood in stool, constipation, diarrhea, nausea and vomiting.  Genitourinary: Negative for dysuria.  Skin: Negative for rash.  Neurological: Negative for dizziness and loss of consciousness.  All other systems reviewed and are negative.    Family History  Problem Relation Age of Onset  . Hypertension Mother     History reviewed. No pertinent past medical history.  History reviewed. No pertinent surgical history.  Social History:  reports that she has quit smoking. She has quit using smokeless tobacco. She reports that she does not drink alcohol or use drugs.  Allergies: No Known Allergies  Medications Prior to Admission  Medication Sig Dispense Refill  . omeprazole (PRILOSEC) 20 MG capsule Take 1 capsule (20 mg total) by mouth daily. 30 capsule 0  . ondansetron (ZOFRAN ODT) 4 MG disintegrating tablet Take 1 tablet (4 mg total) by mouth every 8 (eight) hours as needed for nausea or  vomiting. 20 tablet 0  . PRESCRIPTION MEDICATION 1 Units. Explanon implant    . sucralfate (CARAFATE) 1 g tablet Take 1 tablet (1 g total) by mouth 4 (four) times daily -  with meals and at bedtime for 14 days. 56 tablet 0    Blood pressure (!) 122/59, pulse 77, temperature 97.9 F (36.6 C), temperature source Oral, resp. rate 19, height _0  (1.575 m), weight 60.8 kg (134 lb), SpO2 98 %, unknown if currently breastfeeding.  Physical Exam  Constitutional: She is oriented to person, place, and time. She appears well-developed and well-nourished. No distress.  HENT:  Head: Normocephalic and atraumatic.  Nose: Nose normal.  Mouth/Throat: Oropharynx is clear and moist. No oropharyngeal exudate.  Eyes: Pupils are equal, round, and reactive to light. Conjunctivae are normal. Right eye exhibits no discharge. Left eye exhibits no discharge. No scleral icterus.  Neck: Normal range of motion. Neck supple. No thyromegaly present.  Cardiovascular: Normal rate, regular rhythm, normal heart sounds and intact distal pulses.  No murmur heard. Pulses:      Radial pulses are 2+ on the right side, and 2+ on the left side.  Pulmonary/Chest: Effort normal and breath sounds normal. No respiratory distress. She has no wheezes. She has no rhonchi. She has no rales.  Abdominal: Soft. Normal appearance and bowel sounds are normal. She exhibits no distension. There is no hepatosplenomegaly. There is tenderness in the right upper quadrant. There is no rigidity and no guarding.  Musculoskeletal: Normal range of motion. She exhibits no edema, tenderness or deformity.  Lymphadenopathy:    She has no  cervical adenopathy.  Neurological: She is alert and oriented to person, place, and time.  Skin: Skin is warm and dry. No rash noted. She is not diaphoretic.  Psychiatric: She has a normal mood and affect.  Nursing note and vitals reviewed.   Results for orders placed or performed during the hospital encounter of  01/04/18 (from the past 48 hour(s))  Lipase, blood     Status: None   Collection Time: 01/04/18 12:10 PM  Result Value Ref Range   Lipase 29 11 - 51 U/L    Comment: Performed at [facility], [place] [address] [place], [place] [address]  Comprehensive metabolic panel     Status: Abnormal   Collection Time: 01/04/18 12:10 PM  Result Value Ref Range   Sodium 140 135 - 145 mmol/L   Potassium 3.8 3.5 - 5.1 mmol/L   Chloride 104 101 - 111 mmol/L   CO2 31 22 - 32 mmol/L   Glucose, Bld 106 (H) 65 - 99 mg/dL   BUN 9 6 - 20 mg/dL   Creatinine, Ser 0.58 0.44 - 1.00 mg/dL   Calcium 9.3 8.9 - 10.3 mg/dL   Total Protein 7.7 6.5 - 8.1 g/dL   Albumin 4.4 3.5 - 5.0 g/dL   AST 185 (H) 15 - 41 U/L   ALT 470 (H) 14 - 54 U/L   Alkaline Phosphatase 138 (H) 38 - 126 U/L   Total Bilirubin 0.8 0.3 - 1.2 mg/dL   GFR calc non Af Amer >60 >60 mL/min   GFR calc Af Amer >60 >60 mL/min    Comment: (NOTE) The eGFR has been calculated using the CKD EPI equation. This calculation has not been validated in all clinical situations. eGFR's persistently <60 mL/min signify possible Chronic Kidney Disease.    Anion gap 5 5 - 15    Comment: Performed at [facility], [place] [address] [place], [place] [address]  CBC     Status: None   Collection Time: 01/04/18 12:10 PM  Result Value Ref Range   WBC 5.3 4.0 - 10.5 K/uL   RBC 4.38 3.87 - 5.11 MIL/uL   Hemoglobin 13.4 12.0 - 15.0 g/dL   HCT 40.3 36.0 - 46.0 %   MCV 92.0 78.0 - 100.0 fL   MCH 30.6 26.0 - 34.0 pg   MCHC 33.3 30.0 - 36.0 g/dL   RDW 14.6 11.5 - 15.5 %   Platelets 289 150 - 400 K/uL    Comment: Performed at [facility], [place] [address], [place], [place] [address]  I-Stat beta hCG blood, ED     Status: None   Collection Time: 01/04/18 12:15 PM  Result Value Ref Range   I-stat hCG, quantitative <5.0 <5 mIU/mL   Comment 3            Comment:   GEST. AGE      CONC.  (mIU/mL)   <=1 WEEK        5 -  50     2 WEEKS       50 - 500     3 WEEKS       100 - 10,000     4 WEEKS     1,000 - 30,000        FEMALE AND NON-PREGNANT FEMALE:     LESS THAN 5 mIU/mL   Urinalysis, Routine w reflex microscopic     Status: Abnormal   Collection Time: 01/04/18  1:14 PM  Result  Value Ref Range   Color, Urine STRAW (A) YELLOW   APPearance CLEAR CLEAR   Specific Gravity, Urine 1.006 1.005 - 1.030   pH 9.0 (H) 5.0 - 8.0   Glucose, UA NEGATIVE NEGATIVE mg/dL   Hgb urine dipstick SMALL (A) NEGATIVE   Bilirubin Urine NEGATIVE NEGATIVE   Ketones, ur NEGATIVE NEGATIVE mg/dL   Protein, ur NEGATIVE NEGATIVE mg/dL   Nitrite NEGATIVE NEGATIVE   Leukocytes, UA NEGATIVE NEGATIVE   RBC / HPF 0-5 0 - 5 RBC/hpf   WBC, UA 0-5 0 - 5 WBC/hpf   Bacteria, UA RARE (A) NONE SEEN   Squamous Epithelial / LPF 0-5 0 - 5    Comment: Performed at [facility], [place] [address], [place], [place] [address]  HIV antibody (Routine Testing)     Status: None   Collection Time: 01/04/18  6:36 PM  Result Value Ref Range   HIV Screen 4th Generation wRfx Non Reactive Non Reactive    Comment: (NOTE) Performed At: [facility] [place], [place] [id number] Rush Farmer MD [id number] Performed at [facility], [place] [address], [place], [place] [address]   CBC     Status: None   Collection Time: 01/05/18  4:30 AM  Result Value Ref Range   WBC 4.4 4.0 - 10.5 K/uL   RBC 4.26 3.87 - 5.11 MIL/uL   Hemoglobin 12.8 12.0 - 15.0 g/dL   HCT 38.6 36.0 - 46.0 %   MCV 90.6 78.0 - 100.0 fL   MCH 30.0 26.0 - 34.0 pg   MCHC 33.2 30.0 - 36.0 g/dL   RDW 14.4 11.5 - 15.5 %   Platelets 254 150 - 400 K/uL    Comment: Performed at [facility], [place] [address], [place], [place] [address]  Comprehensive metabolic panel     Status: Abnormal   Collection Time: 01/05/18  4:30 AM  Result Value Ref Range   Sodium 141 135 - 145 mmol/L   Potassium 4.2 3.5 - 5.1 mmol/L    Chloride 108 101 - 111 mmol/L   CO2 28 22 - 32 mmol/L   Glucose, Bld 83 65 - 99 mg/dL   BUN 9 6 - 20 mg/dL   Creatinine, Ser 0.61 0.44 - 1.00 mg/dL   Calcium 9.1 8.9 - 10.3 mg/dL   Total Protein 6.8 6.5 - 8.1 g/dL   Albumin 3.8 3.5 - 5.0 g/dL   AST 85 (H) 15 - 41 U/L   ALT 307 (H) 14 - 54 U/L   Alkaline Phosphatase 114 38 - 126 U/L   Total Bilirubin 0.9 0.3 - 1.2 mg/dL   GFR calc non Af Amer >60 >60 mL/min   GFR calc Af Amer >60 >60 mL/min    Comment: (NOTE) The eGFR has been calculated using the CKD EPI equation. This calculation has not been validated in all clinical situations. eGFR's persistently <60 mL/min signify possible Chronic Kidney Disease.    Anion gap 5 5 - 15    Comment: Performed at [facility], [place] [address], [place],  [address]   Mr 3d Recon At Scanner  Result Date: 01/05/2018 CLINICAL DATA:  Acute right upper quadrant abdominal pain for 3 days. Elevated liver function studies. Evaluate for choledocholithiasis. EXAM: MRI ABDOMEN WITHOUT AND WITH CONTRAST (INCLUDING MRCP) TECHNIQUE: Multiplanar multisequence MR imaging of the abdomen was performed both before and after the administration of intravenous contrast. Heavily T2-weighted images of the biliary and pancreatic ducts  were obtained, and three-dimensional MRCP images were rendered by post processing. CONTRAST:  20m MULTIHANCE GADOBENATE DIMEGLUMINE 529 MG/ML IV SOLN COMPARISON:  Abdominal ultrasound 01/04/2018. FINDINGS: Lower chest:  The visualized lower chest appears unremarkable. Hepatobiliary: The liver is normal in signal without steatosis, focal lesion or abnormal enhancement. There is a 12 mm gallstone. There is no gallbladder wall thickening or surrounding inflammation. There is no intrahepatic biliary dilatation. Extrahepatic biliary dilatation has slightly improved from earlier ultrasound. The common hepatic duct measures 8 mm maximally. No definite filling defects within the common  bile duct on the thin section MRCP images. On coronal image 18 of series 7, there is a questionable tiny filling defect, although there is no corresponding abnormality on the other sequences. Pancreas: Unremarkable. No pancreatic ductal dilatation or surrounding inflammatory changes. No pancreas divisum. Spleen: Normal in size without focal abnormality. Adrenals/Urinary Tract: Both adrenal glands appear normal. Tiny cyst in the interpolar region of the right kidney. The kidneys otherwise appear normal. Stomach/Bowel: No evidence of bowel wall thickening, distention or surrounding inflammatory change. Vascular/Lymphatic: There are no enlarged abdominal lymph nodes. No significant vascular findings. Other: No ascites. Musculoskeletal: No acute or significant osseous findings. IMPRESSION: 1. Cholelithiasis without evidence of cholecystitis. 2. Borderline extrahepatic biliary dilatation, slightly improved from earlier ultrasound. Questionable finding in the distal common bile duct on one series only without definite choledocholithiasis. Improving LFT elevation suggests possible recent passage of a small stone. Electronically Signed   By: WRichardean SaleM.D.   On: 01/05/2018 10:31   Dg Ercp Biliary & Pancreatic Ducts  Result Date: 01/05/2018 CLINICAL DATA:  Gallstones EXAM: ERCP TECHNIQUE: Multiple spot images obtained with the fluoroscopic device and submitted for interpretation post-procedure. FLUOROSCOPY TIME:  Fluoroscopy Time:  2 minutes and 50 seconds Radiation Exposure Index (if provided by the fluoroscopic device): Number of Acquired Spot Images: 0 COMPARISON:  None. FINDINGS: Contrast fills the biliary tree. There are no filling defects in the common bile duct. IMPRESSION: See above. These images were submitted for radiologic interpretation only. Please see the procedural report for the amount of contrast and the fluoroscopy time utilized. Electronically Signed   By: AMarybelle KillingsM.D.   On: 01/05/2018 14:10    Mr Abdomen Mrcp W Wo Contast  Result Date: 01/05/2018 CLINICAL DATA:  Acute right upper quadrant abdominal pain for 3 days. Elevated liver function studies. Evaluate for choledocholithiasis. EXAM: MRI ABDOMEN WITHOUT AND WITH CONTRAST (INCLUDING MRCP) TECHNIQUE: Multiplanar multisequence MR imaging of the abdomen was performed both before and after the administration of intravenous contrast. Heavily T2-weighted images of the biliary and pancreatic ducts were obtained, and three-dimensional MRCP images were rendered by post processing. CONTRAST:  164mMULTIHANCE GADOBENATE DIMEGLUMINE 529 MG/ML IV SOLN COMPARISON:  Abdominal ultrasound 01/04/2018. FINDINGS: Lower chest:  The visualized lower chest appears unremarkable. Hepatobiliary: The liver is normal in signal without steatosis, focal lesion or abnormal enhancement. There is a 12 mm gallstone. There is no gallbladder wall thickening or surrounding inflammation. There is no intrahepatic biliary dilatation. Extrahepatic biliary dilatation has slightly improved from earlier ultrasound. The common hepatic duct measures 8 mm maximally. No definite filling defects within the common bile duct on the thin section MRCP images. On coronal image 18 of series 7, there is a questionable tiny filling defect, although there is no corresponding abnormality on the other sequences. Pancreas: Unremarkable. No pancreatic ductal dilatation or surrounding inflammatory changes. No pancreas divisum. Spleen: Normal in size without focal abnormality. Adrenals/Urinary Tract: Both adrenal glands appear  normal. Tiny cyst in the interpolar region of the right kidney. The kidneys otherwise appear normal. Stomach/Bowel: No evidence of bowel wall thickening, distention or surrounding inflammatory change. Vascular/Lymphatic: There are no enlarged abdominal lymph nodes. No significant vascular findings. Other: No ascites. Musculoskeletal: No acute or significant osseous findings. IMPRESSION:  1. Cholelithiasis without evidence of cholecystitis. 2. Borderline extrahepatic biliary dilatation, slightly improved from earlier ultrasound. Questionable finding in the distal common bile duct on one series only without definite choledocholithiasis. Improving LFT elevation suggests possible recent passage of a small stone. Electronically Signed   By: Richardean Sale M.D.   On: 01/05/2018 10:31   US Abdomen Limited Ruq  Result Date: 01/04/2018 CLINICAL DATA:  Acute right upper quadrant abdominal pain. EXAM: ULTRASOUND ABDOMEN LIMITED RIGHT UPPER QUADRANT COMPARISON:  None. FINDINGS: Gallbladder: Large gallstone measuring 1.9 cm is noted. No gallbladder wall thickening or pericholecystic fluid is noted. No sonographic Murphy's sign is noted. Sludge is noted within gallbladder lumen is well. Common bile duct: Diameter: Dilated at 9 mm. Probable large stone is noted in distal common bile duct. Liver: No focal lesion identified. Within normal limits in parenchymal echogenicity. Portal vein is patent on color Doppler imaging with normal direction of blood flow towards the liver. IMPRESSION: Cholelithiasis is noted without evidence of cholecystitis. Common bile duct dilatation is noted due to stone or stones in distal common bile duct consistent with choledocholithiasis. Further evaluation with MRCP or CT scan is recommended. Electronically Signed   By: Marijo Conception, M.D.   On: 01/04/2018 14:36      Assessment/Plan  Choledocholithiasis - S/P ERCP - OR tomorrow with Dr. Barry Dienes pending lipase - NPO at midnight  Thank you for the consult.   Kalman Drape, [facility] Surgery 01/05/2018, 2:34 PM Pager: [phone] Consults: [id number] Mon-Fri 7:00 am-4:30 pm Sat-Sun 7:00 am-11:30 am

## 2018-01-05 NOTE — Progress Notes (Signed)
PROGRESS NOTE    Jennifer PortsJennifer Hunter  ZOX:[id number] DOB: 04/20/2001 DOA: 01/04/2018 PCP: Patient, No Pcp Per   Brief Narrative:  Jennifer PortsJennifer Hunter is a 17 y.o. female with no previous significant medical history who presents with 5-day history of right upper quadrant abdominal pain.  She stated that eating makes it worse and describes it as a sharp pain that has been constant in nature.  She admits to poor p.o. intake, nausea and vomiting as well.  Denies any fevers at home, chest pain, shortness of breath, cough, diarrhea, dysuria.  She notes that she has had this previously during pregnancy which resolved without intervention. In the ED, Labs were obtained which revealed elevated liver enzyme AST 220, ALT 226.  Right upper quadrant ultrasound revealed choledocholithiasis with dilated common bile duct 9 mm. She was admitted for Suspected Choledocholithiasis and GI Consulted. Underwent MRCP and now undergoing ERCP and General Surgery consulted for Cholecystectomy.   Assessment & Plan:   Principal Problem:   Choledocholithiasis Active Problems:   Elevated LFTs  Abdominal Pain from Suspected Choledocholithiasis -US RUQ: Cholelithiasis is noted without evidence of cholecystitis. Common bile duct dilatation 9mm  -MRCP done and showed Cholelithiasis without evidence of cholecystitis; Also showed Borderline extrahepatic biliary dilatation, slightly improved from earlier ultrasound. Questionable finding in the distal common bile duct on one series only without definite choledocholithiasis. Improving LFT elevation suggests possible recent passage of a small stone -GI consulted and recommending NPO at this time and proceeding with ERCP -GI also recommending Surgical Consultation for Cholecystectomy and Dr. Donell BeersByerly notified by Dr. Marca AnconaKarki -Follow up on ERCP results and General Surgery Evaluation   Abnormal LFT's/Elevated LFT's -Improving -AST is trending down and went from 220 at its peak and  is now 85 -ALT is trending down and went from 470 at its peak and is now 307 -MRCP done showed   Hyperglycemia -Likely Reactive; CMP's have showed Blood Glucose ranging from 83-116 -Check HbA1c -If consistently elevated will consider adding Sensitive Novolog SSI  DVT prophylaxis: Enoxaparin 40 mg sq q24h  Code Status: FULL CODE Family Communication: No family present at bedside  Disposition Plan: Pending further workup and clearance by GI and General Surgery   Consultants:  [facility] General Surgery  Procedures:  ERCP to be done today    Antimicrobials:  Anti-infectives (From admission, onward)   None     Subjective: Seen and examined at bedside and states that her abdominal pain improved.  No chest pain, shortness breath, nausea, vomiting.  Felt okay.  No other complaints or concerns at this time.  Objective: Vitals:   01/04/18 1827 01/04/18 2104 01/05/18 0526 01/05/18 1139  BP: 128/82 130/82 119/73 135/80  Pulse: 75 78 65 (!) 105  Resp: 16 18 14 18   Temp:  99.3 F (37.4 C) 98.2 F (36.8 C) 98.1 F (36.7 C)  TempSrc:  Oral Oral Oral  SpO2:  99% 99% 100%  Weight:    60.8 kg (134 lb)  Height:    5\' 2"  (1.575 m)    Intake/Output Summary (Last 24 hours) at 01/05/2018 1158 Last data filed at 01/05/2018 [id number] Gross per 24 hour  Intake 1281.25 ml  Output -  Net 1281.25 ml   Filed Weights   01/04/18 1800 01/05/18 1139  Weight: 60.8 kg (134 lb) 60.8 kg (134 lb)   Examination: Physical Exam:  Constitutional: WN/WD female in NAD and appears calm and comfortable Eyes: Lids and conjunctivae normal, sclerae anicteric  ENMT: External Ears, Nose appear normal.  Grossly normal hearing. Mucous membranes are moist.  Neck: Appears normal, supple, no cervical masses, normal ROM, no appreciable thyromegaly, no JVD Respiratory: Clear to auscultation bilaterally, no wheezing, rales, rhonchi or crackles. Normal respiratory effort and patient is not tachypenic. No  accessory muscle use.  Cardiovascular: RRR, no murmurs / rubs / gallops. S1 and S2 auscultated. No extremity edema.  Abdomen: Soft, non-tender, non-distended. Bowel sounds positive x4.  GU: Deferred. Musculoskeletal: No clubbing / cyanosis of digits/nails. No joint deformity upper and lower extremities.  Skin: No rashes, lesions, ulcers on a limited skin eval. No induration; Warm and dry.  Neurologic: CN 2-12 grossly intact with no focal deficits. Romberg sign and cerebellar reflexes not assessed.  Psychiatric: Normal judgment and insight. Alert and oriented x 3. Normal mood and appropriate affect.   Data Reviewed: I have personally reviewed following labs and imaging studies  CBC: Recent Labs  Lab 01/02/18 1538 01/04/18 1210 01/05/18 0430  WBC 10.4 5.3 4.4  HGB 13.9 13.4 12.8  HCT 42.8 40.3 38.6  MCV 91.1 92.0 90.6  PLT 313 289 254   Basic Metabolic Panel: Recent Labs  Lab 01/02/18 1538 01/04/18 1210 01/05/18 0430  NA 143 140 141  K 3.7 3.8 4.2  CL 104 104 108  CO2 30 31 28   GLUCOSE 116* 106* 83  BUN 13 9 9   CREATININE 0.65 0.58 0.61  CALCIUM 9.8 9.3 9.1   GFR: Estimated Creatinine Clearance: 97.1 mL/min (by C-G formula based on SCr of 0.61 mg/dL). Liver Function Tests: Recent Labs  Lab 01/02/18 1538 01/04/18 1210 01/05/18 0430  AST 220* 185* 85*  ALT 226* 470* 307*  ALKPHOS 107 138* 114  BILITOT 1.1 0.8 0.9  PROT 7.8 7.7 6.8  ALBUMIN 4.2 4.4 3.8   Recent Labs  Lab 01/02/18 1538 01/04/18 1210  LIPASE 29 29   No results for input(s): AMMONIA in the last 168 hours. Coagulation Profile: No results for input(s): INR, PROTIME in the last 168 hours. Cardiac Enzymes: No results for input(s): CKTOTAL, CKMB, CKMBINDEX, TROPONINI in the last 168 hours. BNP (last 3 results) No results for input(s): PROBNP in the last 8760 hours. HbA1C: No results for input(s): HGBA1C in the last 72 hours. CBG: No results for input(s): GLUCAP in the last 168 hours. Lipid  Profile: No results for input(s): CHOL, HDL, LDLCALC, TRIG, CHOLHDL, LDLDIRECT in the last 72 hours. Thyroid Function Tests: No results for input(s): TSH, T4TOTAL, FREET4, T3FREE, THYROIDAB in the last 72 hours. Anemia Panel: No results for input(s): VITAMINB12, FOLATE, FERRITIN, TIBC, IRON, RETICCTPCT in the last 72 hours. Sepsis Labs: No results for input(s): PROCALCITON, LATICACIDVEN in the last 168 hours.  No results found for this or any previous visit (from the past 240 hour(s)).   Radiology Studies: Mr 3d Recon At Scanner  Result Date: 01/05/2018 CLINICAL DATA:  Acute right upper quadrant abdominal pain for 3 days. Elevated liver function studies. Evaluate for choledocholithiasis. EXAM: MRI ABDOMEN WITHOUT AND WITH CONTRAST (INCLUDING MRCP) TECHNIQUE: Multiplanar multisequence MR imaging of the abdomen was performed both before and after the administration of intravenous contrast. Heavily T2-weighted images of the biliary and pancreatic ducts were obtained, and three-dimensional MRCP images were rendered by post processing. CONTRAST:  12mL MULTIHANCE GADOBENATE DIMEGLUMINE 529 MG/ML IV SOLN COMPARISON:  Abdominal ultrasound 01/04/2018. FINDINGS: Lower chest:  The visualized lower chest appears unremarkable. Hepatobiliary: The liver is normal in signal without steatosis, focal lesion or abnormal enhancement. There is a 12 mm gallstone. There is no  gallbladder wall thickening or surrounding inflammation. There is no intrahepatic biliary dilatation. Extrahepatic biliary dilatation has slightly improved from earlier ultrasound. The common hepatic duct measures 8 mm maximally. No definite filling defects within the common bile duct on the thin section MRCP images. On coronal image 18 of series 7, there is a questionable tiny filling defect, although there is no corresponding abnormality on the other sequences. Pancreas: Unremarkable. No pancreatic ductal dilatation or surrounding inflammatory  changes. No pancreas divisum. Spleen: Normal in size without focal abnormality. Adrenals/Urinary Tract: Both adrenal glands appear normal. Tiny cyst in the interpolar region of the right kidney. The kidneys otherwise appear normal. Stomach/Bowel: No evidence of bowel wall thickening, distention or surrounding inflammatory change. Vascular/Lymphatic: There are no enlarged abdominal lymph nodes. No significant vascular findings. Other: No ascites. Musculoskeletal: No acute or significant osseous findings. IMPRESSION: 1. Cholelithiasis without evidence of cholecystitis. 2. Borderline extrahepatic biliary dilatation, slightly improved from earlier ultrasound. Questionable finding in the distal common bile duct on one series only without definite choledocholithiasis. Improving LFT elevation suggests possible recent passage of a small stone. Electronically Signed   By: Carey Bullocks M.D.   On: 01/05/2018 10:31   Mr Abdomen Mrcp Vivien Rossetti Contast  Result Date: 01/05/2018 CLINICAL DATA:  Acute right upper quadrant abdominal pain for 3 days. Elevated liver function studies. Evaluate for choledocholithiasis. EXAM: MRI ABDOMEN WITHOUT AND WITH CONTRAST (INCLUDING MRCP) TECHNIQUE: Multiplanar multisequence MR imaging of the abdomen was performed both before and after the administration of intravenous contrast. Heavily T2-weighted images of the biliary and pancreatic ducts were obtained, and three-dimensional MRCP images were rendered by post processing. CONTRAST:  12mL MULTIHANCE GADOBENATE DIMEGLUMINE 529 MG/ML IV SOLN COMPARISON:  Abdominal ultrasound 01/04/2018. FINDINGS: Lower chest:  The visualized lower chest appears unremarkable. Hepatobiliary: The liver is normal in signal without steatosis, focal lesion or abnormal enhancement. There is a 12 mm gallstone. There is no gallbladder wall thickening or surrounding inflammation. There is no intrahepatic biliary dilatation. Extrahepatic biliary dilatation has slightly  improved from earlier ultrasound. The common hepatic duct measures 8 mm maximally. No definite filling defects within the common bile duct on the thin section MRCP images. On coronal image 18 of series 7, there is a questionable tiny filling defect, although there is no corresponding abnormality on the other sequences. Pancreas: Unremarkable. No pancreatic ductal dilatation or surrounding inflammatory changes. No pancreas divisum. Spleen: Normal in size without focal abnormality. Adrenals/Urinary Tract: Both adrenal glands appear normal. Tiny cyst in the interpolar region of the right kidney. The kidneys otherwise appear normal. Stomach/Bowel: No evidence of bowel wall thickening, distention or surrounding inflammatory change. Vascular/Lymphatic: There are no enlarged abdominal lymph nodes. No significant vascular findings. Other: No ascites. Musculoskeletal: No acute or significant osseous findings. IMPRESSION: 1. Cholelithiasis without evidence of cholecystitis. 2. Borderline extrahepatic biliary dilatation, slightly improved from earlier ultrasound. Questionable finding in the distal common bile duct on one series only without definite choledocholithiasis. Improving LFT elevation suggests possible recent passage of a small stone. Electronically Signed   By: Carey Bullocks M.D.   On: 01/05/2018 10:31   US Abdomen Limited Ruq  Result Date: 01/04/2018 CLINICAL DATA:  Acute right upper quadrant abdominal pain. EXAM: ULTRASOUND ABDOMEN LIMITED RIGHT UPPER QUADRANT COMPARISON:  None. FINDINGS: Gallbladder: Large gallstone measuring 1.9 cm is noted. No gallbladder wall thickening or pericholecystic fluid is noted. No sonographic Murphy's sign is noted. Sludge is noted within gallbladder lumen is well. Common bile duct: Diameter: Dilated at 9 mm.  Probable large stone is noted in distal common bile duct. Liver: No focal lesion identified. Within normal limits in parenchymal echogenicity. Portal vein is patent on  color Doppler imaging with normal direction of blood flow towards the liver. IMPRESSION: Cholelithiasis is noted without evidence of cholecystitis. Common bile duct dilatation is noted due to stone or stones in distal common bile duct consistent with choledocholithiasis. Further evaluation with MRCP or CT scan is recommended. Electronically Signed   By: Lupita Raider, M.D.   On: 01/04/2018 14:36   Scheduled Meds: . [MAR Hold] enoxaparin (LOVENOX) injection  40 mg Subcutaneous Q24H   Continuous Infusions: . sodium chloride 75 mL/hr at 01/04/18 1815  . sodium chloride    . lactated ringers 1,000 mL (01/05/18 1146)    LOS: 1 day   Merlene Laughter, DO [facility] Pager [phone]  If 7PM-7AM, please contact night-coverage [email or web address] Password [facility] 01/05/2018, 11:58 AM

## 2018-01-05 NOTE — Op Note (Signed)
[facility] Patient Name: Jennifer Hunter Procedure Date: 01/05/2018 MRN: [id number] Attending MD: Kerin Salen , MD Date of Birth: 2000-09-09 CSN: [id number] Age: 17 Admit Type: Inpatient Procedure:                ERCP Indications:              Common bile duct stone(s) Providers:                Kerin Salen, MD, Stephan Minister, RN, Harrington Challenger,                            Technician, Albertina Senegal. Alday CRNA, CRNA Referring MD:              Medicines:                Monitored Anesthesia Care Complications:            No immediate complications. Estimated Blood Loss:     Estimated blood loss: none. Estimated blood loss                            was minimal. Procedure:                Pre-Anesthesia Assessment:                           - Prior to the procedure, a History and Physical                            was performed, and patient medications and                            allergies were reviewed. The patient's tolerance of                            previous anesthesia was also reviewed. The risks                            and benefits of the procedure and the sedation                            options and risks were discussed with the patient.                            All questions were answered, and informed consent                            was obtained. Prior Anticoagulants: The patient has                            taken no previous anticoagulant or antiplatelet                            agents. ASA Grade Assessment: II - A patient with  mild systemic disease. After reviewing the risks                            and benefits, the patient was deemed in                            satisfactory condition to undergo the procedure.                           After obtaining informed consent, the scope was                            passed under direct vision. Throughout the                            procedure, the patient's blood  pressure, pulse, and                            oxygen saturations were monitored continuously. The                            [id number] scope was introduced through the                            mouth, and used to inject contrast into and used to                            inject contrast into the bile duct. The ERCP was                            accomplished without difficulty. The patient                            tolerated the procedure well. Findings:      The scout film was normal. The esophagus was successfully intubated       under direct vision. The scope was advanced to a normal major papilla in       the descending duodenum without detailed examination of the pharynx,       larynx and associated structures, and upper GI tract. The upper GI tract       was grossly normal. The bile duct was deeply cannulated with the       sphincterotome. Contrast was injected. I personally interpreted the bile       duct images. There was brisk flow of contrast through the ducts. Image       quality was excellent. Contrast extended to the entire biliary tree. The       lower third of the main bile duct contained filling defect(s) thought to       be a stone. The biliary tree was otherwise normal. A long 0.035 inch       Stiff Jagwire was passed into the biliary tree. A 12 mm biliary       sphincterotomy was made with a braided sphincterotome using ERBE       electrocautery. There was no post-sphincterotomy bleeding. The biliary  tree was swept with a 12 mm balloon starting at the bifurcation. One       stone was removed. No stones remained.      The pancreatic duct was never canulated or injected during the procedure. Impression:               - A filling defect consistent with a stone was seen                            on the cholangiogram.                           - Choledocholithiasis was found. Complete removal                            was accomplished by biliary  sphincterotomy and                            balloon extraction.                           - A biliary sphincterotomy was performed.                           - The biliary tree was swept. Moderate Sedation:      Patient did not receive moderate sedation for this procedure, but       instead received monitored anesthesia care. Recommendation:           - Clear liquid diet.                           - Refer to a surgeon today,plan cholecystectomy. Procedure Code(s):        --- Professional ---                           [phone], Endoscopic retrograde                            cholangiopancreatography (ERCP); with removal of                            calculi/debris from biliary/pancreatic duct(s)                           [id number], Endoscopic retrograde                            cholangiopancreatography (ERCP); with                            sphincterotomy/papillotomy                           [phone], Endoscopic catheterization of the biliary                            ductal system, radiological supervision and  interpretation Diagnosis Code(s):        --- Professional ---                           K80.50, Calculus of bile duct without cholangitis                            or cholecystitis without obstruction                           R93.2, Abnormal findings on diagnostic imaging of                            liver and biliary tract CPT copyright 2017 [employer]. All rights reserved. The codes documented in this report are preliminary and upon coder review may  be revised to meet current compliance requirements. Kerin Salen, MD 01/05/2018 12:42:45 PM This report has been signed electronically. Number of Addenda: 0

## 2018-01-05 NOTE — H&P (View-Only) (Signed)
[name] Consult/Admission Note  Jennifer Hunter 08/28/2000  [id number].    Requesting MD: Dr. Therisa Doyne Chief Complaint/Reason for Consult: choledocholithiasis  HPI:   Pt is a healthy 17 yo female who was admitted for abdominal pain and elevated LFT's. Pt was found to have a 1.9cm gallstone and choledocholithiasis. Pt underwent ERCP today. Pt states she had RUQ pain for 5 days before admission. She had similar pain when she was pregnant but this is the first time she has had this pain since giving birth in Aug 2017. Pain was in the RUQ, radiating into her chest, severe at times with associated nausea and vomiting. She currently has mild non radiating pain relieved with pain medication. No nausea.   ROS:  Review of Systems  Constitutional: Negative for chills, diaphoresis and fever.  HENT: Negative for sore throat.   Respiratory: Negative for cough and shortness of breath.   Cardiovascular: Negative for chest pain.  Gastrointestinal: Positive for abdominal pain. Negative for blood in stool, constipation, diarrhea, nausea and vomiting.  Genitourinary: Negative for dysuria.  Skin: Negative for rash.  Neurological: Negative for dizziness and loss of consciousness.  All other systems reviewed and are negative.    Family History  Problem Relation Age of Onset  . Hypertension Mother     History reviewed. No pertinent past medical history.  History reviewed. No pertinent surgical history.  Social History:  reports that she has quit smoking. She has quit using smokeless tobacco. She reports that she does not drink alcohol or use drugs.  Allergies: No Known Allergies  Medications Prior to Admission  Medication Sig Dispense Refill  . omeprazole (PRILOSEC) 20 MG capsule Take 1 capsule (20 mg total) by mouth daily. 30 capsule 0  . ondansetron (ZOFRAN ODT) 4 MG disintegrating tablet Take 1 tablet (4 mg total) by mouth every 8 (eight) hours as needed for nausea or  vomiting. 20 tablet 0  . PRESCRIPTION MEDICATION 1 Units. Explanon implant    . sucralfate (CARAFATE) 1 g tablet Take 1 tablet (1 g total) by mouth 4 (four) times daily -  with meals and at bedtime for 14 days. 56 tablet 0    Blood pressure (!) 122/59, pulse 77, temperature 97.9 F (36.6 C), temperature source Oral, resp. rate 19, height _0  (1.575 m), weight 60.8 kg (134 lb), SpO2 98 %, unknown if currently breastfeeding.  Physical Exam  Constitutional: She is oriented to person, place, and time. She appears well-developed and well-nourished. No distress.  HENT:  Head: Normocephalic and atraumatic.  Nose: Nose normal.  Mouth/Throat: Oropharynx is clear and moist. No oropharyngeal exudate.  Eyes: Pupils are equal, round, and reactive to light. Conjunctivae are normal. Right eye exhibits no discharge. Left eye exhibits no discharge. No scleral icterus.  Neck: Normal range of motion. Neck supple. No thyromegaly present.  Cardiovascular: Normal rate, regular rhythm, normal heart sounds and intact distal pulses.  No murmur heard. Pulses:      Radial pulses are 2+ on the right side, and 2+ on the left side.  Pulmonary/Chest: Effort normal and breath sounds normal. No respiratory distress. She has no wheezes. She has no rhonchi. She has no rales.  Abdominal: Soft. Normal appearance and bowel sounds are normal. She exhibits no distension. There is no hepatosplenomegaly. There is tenderness in the right upper quadrant. There is no rigidity and no guarding.  Musculoskeletal: Normal range of motion. She exhibits no edema, tenderness or deformity.  Lymphadenopathy:    She has no  cervical adenopathy.  Neurological: She is alert and oriented to person, place, and time.  Skin: Skin is warm and dry. No rash noted. She is not diaphoretic.  Psychiatric: She has a normal mood and affect.  Nursing note and vitals reviewed.   Results for orders placed or performed during the hospital encounter of  01/04/18 (from the past 48 hour(s))  Lipase, blood     Status: None   Collection Time: 01/04/18 12:10 PM  Result Value Ref Range   Lipase 29 11 - 51 U/L    Comment: Performed at [facility], [place] [address] [place], [place] [address]  Comprehensive metabolic panel     Status: Abnormal   Collection Time: 01/04/18 12:10 PM  Result Value Ref Range   Sodium 140 135 - 145 mmol/L   Potassium 3.8 3.5 - 5.1 mmol/L   Chloride 104 101 - 111 mmol/L   CO2 31 22 - 32 mmol/L   Glucose, Bld 106 (H) 65 - 99 mg/dL   BUN 9 6 - 20 mg/dL   Creatinine, Ser 0.58 0.44 - 1.00 mg/dL   Calcium 9.3 8.9 - 10.3 mg/dL   Total Protein 7.7 6.5 - 8.1 g/dL   Albumin 4.4 3.5 - 5.0 g/dL   AST 185 (H) 15 - 41 U/L   ALT 470 (H) 14 - 54 U/L   Alkaline Phosphatase 138 (H) 38 - 126 U/L   Total Bilirubin 0.8 0.3 - 1.2 mg/dL   GFR calc non Af Amer >60 >60 mL/min   GFR calc Af Amer >60 >60 mL/min    Comment: (NOTE) The eGFR has been calculated using the CKD EPI equation. This calculation has not been validated in all clinical situations. eGFR's persistently <60 mL/min signify possible Chronic Kidney Disease.    Anion gap 5 5 - 15    Comment: Performed at [facility], [place] [address] [place], [place] [address]  CBC     Status: None   Collection Time: 01/04/18 12:10 PM  Result Value Ref Range   WBC 5.3 4.0 - 10.5 K/uL   RBC 4.38 3.87 - 5.11 MIL/uL   Hemoglobin 13.4 12.0 - 15.0 g/dL   HCT 40.3 36.0 - 46.0 %   MCV 92.0 78.0 - 100.0 fL   MCH 30.6 26.0 - 34.0 pg   MCHC 33.3 30.0 - 36.0 g/dL   RDW 14.6 11.5 - 15.5 %   Platelets 289 150 - 400 K/uL    Comment: Performed at [facility], [place] [address], [place], [place] [address]  I-Stat beta hCG blood, ED     Status: None   Collection Time: 01/04/18 12:15 PM  Result Value Ref Range   I-stat hCG, quantitative <5.0 <5 mIU/mL   Comment 3            Comment:   GEST. AGE      CONC.  (mIU/mL)   <=1 WEEK        5 -  50     2 WEEKS       50 - 500     3 WEEKS       100 - 10,000     4 WEEKS     1,000 - 30,000        FEMALE AND NON-PREGNANT FEMALE:     LESS THAN 5 mIU/mL   Urinalysis, Routine w reflex microscopic     Status: Abnormal   Collection Time: 01/04/18  1:14 PM  Result  Value Ref Range   Color, Urine STRAW (A) YELLOW   APPearance CLEAR CLEAR   Specific Gravity, Urine 1.006 1.005 - 1.030   pH 9.0 (H) 5.0 - 8.0   Glucose, UA NEGATIVE NEGATIVE mg/dL   Hgb urine dipstick SMALL (A) NEGATIVE   Bilirubin Urine NEGATIVE NEGATIVE   Ketones, ur NEGATIVE NEGATIVE mg/dL   Protein, ur NEGATIVE NEGATIVE mg/dL   Nitrite NEGATIVE NEGATIVE   Leukocytes, UA NEGATIVE NEGATIVE   RBC / HPF 0-5 0 - 5 RBC/hpf   WBC, UA 0-5 0 - 5 WBC/hpf   Bacteria, UA RARE (A) NONE SEEN   Squamous Epithelial / LPF 0-5 0 - 5    Comment: Performed at [facility], [place] [address], [place], [place] [address]  HIV antibody (Routine Testing)     Status: None   Collection Time: 01/04/18  6:36 PM  Result Value Ref Range   HIV Screen 4th Generation wRfx Non Reactive Non Reactive    Comment: (NOTE) Performed At: [facility] [place], [place] [id number] Rush Farmer MD [id number] Performed at [facility], [place] [address], [place], [place] [address]   CBC     Status: None   Collection Time: 01/05/18  4:30 AM  Result Value Ref Range   WBC 4.4 4.0 - 10.5 K/uL   RBC 4.26 3.87 - 5.11 MIL/uL   Hemoglobin 12.8 12.0 - 15.0 g/dL   HCT 38.6 36.0 - 46.0 %   MCV 90.6 78.0 - 100.0 fL   MCH 30.0 26.0 - 34.0 pg   MCHC 33.2 30.0 - 36.0 g/dL   RDW 14.4 11.5 - 15.5 %   Platelets 254 150 - 400 K/uL    Comment: Performed at [facility], [place] [address], [place], [place] [address]  Comprehensive metabolic panel     Status: Abnormal   Collection Time: 01/05/18  4:30 AM  Result Value Ref Range   Sodium 141 135 - 145 mmol/L   Potassium 4.2 3.5 - 5.1 mmol/L    Chloride 108 101 - 111 mmol/L   CO2 28 22 - 32 mmol/L   Glucose, Bld 83 65 - 99 mg/dL   BUN 9 6 - 20 mg/dL   Creatinine, Ser 0.61 0.44 - 1.00 mg/dL   Calcium 9.1 8.9 - 10.3 mg/dL   Total Protein 6.8 6.5 - 8.1 g/dL   Albumin 3.8 3.5 - 5.0 g/dL   AST 85 (H) 15 - 41 U/L   ALT 307 (H) 14 - 54 U/L   Alkaline Phosphatase 114 38 - 126 U/L   Total Bilirubin 0.9 0.3 - 1.2 mg/dL   GFR calc non Af Amer >60 >60 mL/min   GFR calc Af Amer >60 >60 mL/min    Comment: (NOTE) The eGFR has been calculated using the CKD EPI equation. This calculation has not been validated in all clinical situations. eGFR's persistently <60 mL/min signify possible Chronic Kidney Disease.    Anion gap 5 5 - 15    Comment: Performed at [facility], [place] [address], [place],  [address]   Mr 3d Recon At Scanner  Result Date: 01/05/2018 CLINICAL DATA:  Acute right upper quadrant abdominal pain for 3 days. Elevated liver function studies. Evaluate for choledocholithiasis. EXAM: MRI ABDOMEN WITHOUT AND WITH CONTRAST (INCLUDING MRCP) TECHNIQUE: Multiplanar multisequence MR imaging of the abdomen was performed both before and after the administration of intravenous contrast. Heavily T2-weighted images of the biliary and pancreatic ducts  were obtained, and three-dimensional MRCP images were rendered by post processing. CONTRAST:  20m MULTIHANCE GADOBENATE DIMEGLUMINE 529 MG/ML IV SOLN COMPARISON:  Abdominal ultrasound 01/04/2018. FINDINGS: Lower chest:  The visualized lower chest appears unremarkable. Hepatobiliary: The liver is normal in signal without steatosis, focal lesion or abnormal enhancement. There is a 12 mm gallstone. There is no gallbladder wall thickening or surrounding inflammation. There is no intrahepatic biliary dilatation. Extrahepatic biliary dilatation has slightly improved from earlier ultrasound. The common hepatic duct measures 8 mm maximally. No definite filling defects within the common  bile duct on the thin section MRCP images. On coronal image 18 of series 7, there is a questionable tiny filling defect, although there is no corresponding abnormality on the other sequences. Pancreas: Unremarkable. No pancreatic ductal dilatation or surrounding inflammatory changes. No pancreas divisum. Spleen: Normal in size without focal abnormality. Adrenals/Urinary Tract: Both adrenal glands appear normal. Tiny cyst in the interpolar region of the right kidney. The kidneys otherwise appear normal. Stomach/Bowel: No evidence of bowel wall thickening, distention or surrounding inflammatory change. Vascular/Lymphatic: There are no enlarged abdominal lymph nodes. No significant vascular findings. Other: No ascites. Musculoskeletal: No acute or significant osseous findings. IMPRESSION: 1. Cholelithiasis without evidence of cholecystitis. 2. Borderline extrahepatic biliary dilatation, slightly improved from earlier ultrasound. Questionable finding in the distal common bile duct on one series only without definite choledocholithiasis. Improving LFT elevation suggests possible recent passage of a small stone. Electronically Signed   By: WRichardean SaleM.D.   On: 01/05/2018 10:31   Dg Ercp Biliary & Pancreatic Ducts  Result Date: 01/05/2018 CLINICAL DATA:  Gallstones EXAM: ERCP TECHNIQUE: Multiple spot images obtained with the fluoroscopic device and submitted for interpretation post-procedure. FLUOROSCOPY TIME:  Fluoroscopy Time:  2 minutes and 50 seconds Radiation Exposure Index (if provided by the fluoroscopic device): Number of Acquired Spot Images: 0 COMPARISON:  None. FINDINGS: Contrast fills the biliary tree. There are no filling defects in the common bile duct. IMPRESSION: See above. These images were submitted for radiologic interpretation only. Please see the procedural report for the amount of contrast and the fluoroscopy time utilized. Electronically Signed   By: AMarybelle KillingsM.D.   On: 01/05/2018 14:10    Mr Abdomen Mrcp W Wo Contast  Result Date: 01/05/2018 CLINICAL DATA:  Acute right upper quadrant abdominal pain for 3 days. Elevated liver function studies. Evaluate for choledocholithiasis. EXAM: MRI ABDOMEN WITHOUT AND WITH CONTRAST (INCLUDING MRCP) TECHNIQUE: Multiplanar multisequence MR imaging of the abdomen was performed both before and after the administration of intravenous contrast. Heavily T2-weighted images of the biliary and pancreatic ducts were obtained, and three-dimensional MRCP images were rendered by post processing. CONTRAST:  164mMULTIHANCE GADOBENATE DIMEGLUMINE 529 MG/ML IV SOLN COMPARISON:  Abdominal ultrasound 01/04/2018. FINDINGS: Lower chest:  The visualized lower chest appears unremarkable. Hepatobiliary: The liver is normal in signal without steatosis, focal lesion or abnormal enhancement. There is a 12 mm gallstone. There is no gallbladder wall thickening or surrounding inflammation. There is no intrahepatic biliary dilatation. Extrahepatic biliary dilatation has slightly improved from earlier ultrasound. The common hepatic duct measures 8 mm maximally. No definite filling defects within the common bile duct on the thin section MRCP images. On coronal image 18 of series 7, there is a questionable tiny filling defect, although there is no corresponding abnormality on the other sequences. Pancreas: Unremarkable. No pancreatic ductal dilatation or surrounding inflammatory changes. No pancreas divisum. Spleen: Normal in size without focal abnormality. Adrenals/Urinary Tract: Both adrenal glands appear  normal. Tiny cyst in the interpolar region of the right kidney. The kidneys otherwise appear normal. Stomach/Bowel: No evidence of bowel wall thickening, distention or surrounding inflammatory change. Vascular/Lymphatic: There are no enlarged abdominal lymph nodes. No significant vascular findings. Other: No ascites. Musculoskeletal: No acute or significant osseous findings. IMPRESSION:  1. Cholelithiasis without evidence of cholecystitis. 2. Borderline extrahepatic biliary dilatation, slightly improved from earlier ultrasound. Questionable finding in the distal common bile duct on one series only without definite choledocholithiasis. Improving LFT elevation suggests possible recent passage of a small stone. Electronically Signed   By: Richardean Sale M.D.   On: 01/05/2018 10:31   US Abdomen Limited Ruq  Result Date: 01/04/2018 CLINICAL DATA:  Acute right upper quadrant abdominal pain. EXAM: ULTRASOUND ABDOMEN LIMITED RIGHT UPPER QUADRANT COMPARISON:  None. FINDINGS: Gallbladder: Large gallstone measuring 1.9 cm is noted. No gallbladder wall thickening or pericholecystic fluid is noted. No sonographic Murphy's sign is noted. Sludge is noted within gallbladder lumen is well. Common bile duct: Diameter: Dilated at 9 mm. Probable large stone is noted in distal common bile duct. Liver: No focal lesion identified. Within normal limits in parenchymal echogenicity. Portal vein is patent on color Doppler imaging with normal direction of blood flow towards the liver. IMPRESSION: Cholelithiasis is noted without evidence of cholecystitis. Common bile duct dilatation is noted due to stone or stones in distal common bile duct consistent with choledocholithiasis. Further evaluation with MRCP or CT scan is recommended. Electronically Signed   By: Marijo Conception, M.D.   On: 01/04/2018 14:36      Assessment/Plan  Choledocholithiasis - S/P ERCP - OR tomorrow with Dr. Barry Dienes pending lipase - NPO at midnight  Thank you for the consult.   Kalman Drape, [facility] Surgery 01/05/2018, 2:34 PM Pager: [phone] Consults: [id number] Mon-Fri 7:00 am-4:30 pm Sat-Sun 7:00 am-11:30 am

## 2018-01-05 NOTE — Consult Note (Addendum)
[facility] Consult  Referring Provider: Zettie Pho, MD Primary Care Physician:  Patient, No Pcp Per Primary Gastroenterologist: Gentry Fitz  Reason for Consultation:  Abnormal LFTs, CBD stone  HPI: Jennifer Hunter is a 17 y.o. female resented to the ER yesterday after receiving a call from urgent center that her labs were abnormal for liver enzymes. Patient had gone to urgent center on 01/02/18 with abdominal pain and had T bili 1.1/AST 220/AST 226/ALP 107. Her symptoms started in 2017 during pregnancy, described as epigastric abdominal pain without radiation but worsening with meals, treated empirically with use of over-the-counter Tums and Zantac. She had recurrence of the symptoms once in the last 2 years. This Sunday, after eating late night, she woke up with epigastric abdominal pain and decided to go to the urgent Center on Tuesday when pain recurred. Patient denies nausea, vomiting, fevers. Currently denies any abdominal pain. Denies unintentional weight loss or loss of appetite. Does not take any medications on a regular basis.   History reviewed. No pertinent past medical history.  History reviewed. No pertinent surgical history.  Prior to Admission medications   Medication Sig Start Date End Date Taking? Authorizing Provider  omeprazole (PRILOSEC) 20 MG capsule Take 1 capsule (20 mg total) by mouth daily. 01/02/18  Yes Wieters, Hallie C, PA-C  ondansetron (ZOFRAN ODT) 4 MG disintegrating tablet Take 1 tablet (4 mg total) by mouth every 8 (eight) hours as needed for nausea or vomiting. 01/02/18  Yes Wieters, Hallie C, PA-C  PRESCRIPTION MEDICATION 1 Units. Explanon implant   Yes [provider]  sucralfate (CARAFATE) 1 g tablet Take 1 tablet (1 g total) by mouth 4 (four) times daily -  with meals and at bedtime for 14 days. 01/02/18 01/16/18 Yes Wieters, Hallie C, PA-C    Current Facility-Administered Medications  Medication Dose Route Frequency Provider  Last Rate Last Dose  . 0.9 %  sodium chloride infusion   Intravenous Continuous Cieara, Stierwalt, DO 75 mL/hr at 01/04/18 1815    . 0.9 %  sodium chloride infusion   Intravenous Continuous Kerin Salen, MD      . acetaminophen (TYLENOL) tablet 650 mg  650 mg Oral Q6H PRN Noralee Stain, DO       Or  . acetaminophen (TYLENOL) suppository 650 mg  650 mg Rectal Q6H PRN Noralee Stain, DO      . enoxaparin (LOVENOX) injection 40 mg  40 mg Subcutaneous Q24H Noralee Stain, DO   40 mg at 01/04/18 2151  . ondansetron (ZOFRAN) tablet 4 mg  4 mg Oral Q6H PRN Noralee Stain, DO       Or  . ondansetron [facility]) injection 4 mg  4 mg Intravenous Q6H PRN Noralee Stain, DO      . oxyCODONE (Oxy IR/ROXICODONE) immediate release tablet 5 mg  5 mg Oral Q4H PRN Noralee Stain, DO        Allergies as of 01/04/2018  . (No Known Allergies)    Family History  Problem Relation Age of Onset  . Hypertension Mother     Social History   Socioeconomic History  . Marital status: Single    Spouse name: Not on file  . Number of children: Not on file  . Years of education: Not on file  . Highest education level: Not on file  Occupational History  . Not on file  Social Needs  . Financial resource strain: Not on file  . Food insecurity:    Worry: Not on file  Inability: Not on file  . Transportation needs:    Medical: Not on file    Non-medical: Not on file  Tobacco Use  . Smoking status: Former Games developermoker  . Smokeless tobacco: Former Engineer, waterUser  Substance and Sexual Activity  . Alcohol use: No  . Drug use: No  . Sexual activity: Yes    Birth control/protection: Implant  Lifestyle  . Physical activity:    Days per week: Not on file    Minutes per session: Not on file  . Stress: Not on file  Relationships  . Social connections:    Talks on phone: Not on file    Gets together: Not on file    Attends religious service: Not on file    Active member of club or organization: Not on file    Attends meetings  of clubs or organizations: Not on file    Relationship status: Not on file  . Intimate partner violence:    Fear of current or ex partner: Not on file    Emotionally abused: Not on file    Physically abused: Not on file    Forced sexual activity: Not on file  Other Topics Concern  . Not on file  Social History Narrative   ** Merged History Encounter **        Review of Systems:  GI: Described in detail in HPI.    Gen: Denies any fever, chills, rigors, night sweats, anorexia, fatigue, weakness, malaise, involuntary weight loss, and sleep disorder CV: Denies chest pain, angina, palpitations, syncope, orthopnea, PND, peripheral edema, and claudication. Resp: Denies dyspnea, cough, sputum, wheezing, coughing up blood. GU : Denies urinary burning, blood in urine, urinary frequency, urinary hesitancy, nocturnal urination, and urinary incontinence. MS: Denies joint pain or swelling.  Denies muscle weakness, cramps, atrophy.  Derm: Denies rash, itching, oral ulcerations, hives, unhealing ulcers.  Psych: Denies depression, anxiety, memory loss, suicidal ideation, hallucinations,  and confusion. Heme: Denies bruising, bleeding, and enlarged lymph nodes. Neuro:  Denies any headaches, dizziness, paresthesias. Endo:  Denies any problems with DM, thyroid, adrenal function.  Physical Exam: Vital signs in last 24 hours: Temp:  [98 F (36.7 C)-99.3 F (37.4 C)] 98.2 F (36.8 C) (06/21 0526) Pulse Rate:  [62-86] 65 (06/21 0526) Resp:  [12-18] 14 (06/21 0526) BP: (117-137)/(72-82) 119/73 (06/21 0526) SpO2:  [99 %-100 %] 99 % (06/21 0526) Weight:  [60.8 kg (134 lb)] 60.8 kg (134 lb) (06/20 1800) Last BM Date: 01/01/18  General:   Alert,  Well-developed, well-nourished, pleasant and cooperative in NAD Head:  Normocephalic and atraumatic. Eyes:  Sclera clear, no icterus.   Conjunctiva pink. Ears:  Normal auditory acuity. Nose:  No deformity, discharge,  or lesions. Mouth:  No deformity or  lesions.  Oropharynx pink & moist. Neck:  Supple; no masses or thyromegaly. Lungs:  Clear throughout to auscultation.   No wheezes, crackles, or rhonchi. No acute distress. Heart:  Regular rate and rhythm; no murmurs, clicks, rubs,  or gallops. Extremities:  Without clubbing or edema. Neurologic:  Alert and  oriented x4;  grossly normal neurologically. Skin:  Intact without significant lesions or rashes. Psych:  Alert and cooperative. Normal mood and affect. Abdomen:  Soft, nontender and nondistended. No masses, hepatosplenomegaly or hernias noted. Normal bowel sounds, without guarding, and without rebound.         Lab Results: Recent Labs    01/02/18 1538 01/04/18 1210 01/05/18 0430  WBC 10.4 5.3 4.4  HGB 13.9 13.4 12.8  HCT  42.8 40.3 38.6  PLT 313 289 254   BMET Recent Labs    01/02/18 1538 01/04/18 1210 01/05/18 0430  NA 143 140 141  K 3.7 3.8 4.2  CL 104 104 108  CO2 30 31 28   GLUCOSE 116* 106* 83  BUN 13 9 9   CREATININE 0.65 0.58 0.61  CALCIUM 9.8 9.3 9.1   LFT Recent Labs    01/05/18 0430  PROT 6.8  ALBUMIN 3.8  AST 85*  ALT 307*  ALKPHOS 114  BILITOT 0.9   PT/INR No results for input(s): LABPROT, INR in the last 72 hours.  Studies/Results: Mr 3d Recon At Scanner  Result Date: 01/05/2018 CLINICAL DATA:  Acute right upper quadrant abdominal pain for 3 days. Elevated liver function studies. Evaluate for choledocholithiasis. EXAM: MRI ABDOMEN WITHOUT AND WITH CONTRAST (INCLUDING MRCP) TECHNIQUE: Multiplanar multisequence MR imaging of the abdomen was performed both before and after the administration of intravenous contrast. Heavily T2-weighted images of the biliary and pancreatic ducts were obtained, and three-dimensional MRCP images were rendered by post processing. CONTRAST:  12mL MULTIHANCE GADOBENATE DIMEGLUMINE 529 MG/ML IV SOLN COMPARISON:  Abdominal ultrasound 01/04/2018. FINDINGS: Lower chest:  The visualized lower chest appears unremarkable.  Hepatobiliary: The liver is normal in signal without steatosis, focal lesion or abnormal enhancement. There is a 12 mm gallstone. There is no gallbladder wall thickening or surrounding inflammation. There is no intrahepatic biliary dilatation. Extrahepatic biliary dilatation has slightly improved from earlier ultrasound. The common hepatic duct measures 8 mm maximally. No definite filling defects within the common bile duct on the thin section MRCP images. On coronal image 18 of series 7, there is a questionable tiny filling defect, although there is no corresponding abnormality on the other sequences. Pancreas: Unremarkable. No pancreatic ductal dilatation or surrounding inflammatory changes. No pancreas divisum. Spleen: Normal in size without focal abnormality. Adrenals/Urinary Tract: Both adrenal glands appear normal. Tiny cyst in the interpolar region of the right kidney. The kidneys otherwise appear normal. Stomach/Bowel: No evidence of bowel wall thickening, distention or surrounding inflammatory change. Vascular/Lymphatic: There are no enlarged abdominal lymph nodes. No significant vascular findings. Other: No ascites. Musculoskeletal: No acute or significant osseous findings. IMPRESSION: 1. Cholelithiasis without evidence of cholecystitis. 2. Borderline extrahepatic biliary dilatation, slightly improved from earlier ultrasound. Questionable finding in the distal common bile duct on one series only without definite choledocholithiasis. Improving LFT elevation suggests possible recent passage of a small stone. Electronically Signed   By: Carey Bullocks M.D.   On: 01/05/2018 10:31   Mr Abdomen Mrcp Vivien Rossetti Contast  Result Date: 01/05/2018 CLINICAL DATA:  Acute right upper quadrant abdominal pain for 3 days. Elevated liver function studies. Evaluate for choledocholithiasis. EXAM: MRI ABDOMEN WITHOUT AND WITH CONTRAST (INCLUDING MRCP) TECHNIQUE: Multiplanar multisequence MR imaging of the abdomen was performed  both before and after the administration of intravenous contrast. Heavily T2-weighted images of the biliary and pancreatic ducts were obtained, and three-dimensional MRCP images were rendered by post processing. CONTRAST:  12mL MULTIHANCE GADOBENATE DIMEGLUMINE 529 MG/ML IV SOLN COMPARISON:  Abdominal ultrasound 01/04/2018. FINDINGS: Lower chest:  The visualized lower chest appears unremarkable. Hepatobiliary: The liver is normal in signal without steatosis, focal lesion or abnormal enhancement. There is a 12 mm gallstone. There is no gallbladder wall thickening or surrounding inflammation. There is no intrahepatic biliary dilatation. Extrahepatic biliary dilatation has slightly improved from earlier ultrasound. The common hepatic duct measures 8 mm maximally. No definite filling defects within the common bile duct on the  thin section MRCP images. On coronal image 18 of series 7, there is a questionable tiny filling defect, although there is no corresponding abnormality on the other sequences. Pancreas: Unremarkable. No pancreatic ductal dilatation or surrounding inflammatory changes. No pancreas divisum. Spleen: Normal in size without focal abnormality. Adrenals/Urinary Tract: Both adrenal glands appear normal. Tiny cyst in the interpolar region of the right kidney. The kidneys otherwise appear normal. Stomach/Bowel: No evidence of bowel wall thickening, distention or surrounding inflammatory change. Vascular/Lymphatic: There are no enlarged abdominal lymph nodes. No significant vascular findings. Other: No ascites. Musculoskeletal: No acute or significant osseous findings. IMPRESSION: 1. Cholelithiasis without evidence of cholecystitis. 2. Borderline extrahepatic biliary dilatation, slightly improved from earlier ultrasound. Questionable finding in the distal common bile duct on one series only without definite choledocholithiasis. Improving LFT elevation suggests possible recent passage of a small stone.  Electronically Signed   By: Carey Bullocks M.D.   On: 01/05/2018 10:31   US Abdomen Limited Ruq  Result Date: 01/04/2018 CLINICAL DATA:  Acute right upper quadrant abdominal pain. EXAM: ULTRASOUND ABDOMEN LIMITED RIGHT UPPER QUADRANT COMPARISON:  None. FINDINGS: Gallbladder: Large gallstone measuring 1.9 cm is noted. No gallbladder wall thickening or pericholecystic fluid is noted. No sonographic Murphy's sign is noted. Sludge is noted within gallbladder lumen is well. Common bile duct: Diameter: Dilated at 9 mm. Probable large stone is noted in distal common bile duct. Liver: No focal lesion identified. Within normal limits in parenchymal echogenicity. Portal vein is patent on color Doppler imaging with normal direction of blood flow towards the liver. IMPRESSION: Cholelithiasis is noted without evidence of cholecystitis. Common bile duct dilatation is noted due to stone or stones in distal common bile duct consistent with choledocholithiasis. Further evaluation with MRCP or CT scan is recommended. Electronically Signed   By: Lupita Raider, M.D.   On: 01/04/2018 14:36    Impression: MRCP showed cholelithiasis without cholecystitis, borderline extrahepatic biliary dilatation, slightly improved from prior ultrasound, questionable distal common bile duct, possible recent passage of small stone. Ultrasound showed a CBD of 9 mm,gallstones, largest 1.9 cm, probable large stone in distal common bile duct. T bili 0.9, AST improved from 220 to 85, AST improved from 470 to 307, ALP normal 114  Plan: Patient remains nothing by mouth. Discussed about radiological and lab findings. Will proceed with ERCP. The risk and benefits of the procedure were discussed with the patient in details, she understands and verbalizes consent. Recommend surgical consult for cholecystectomy.   LOS: 1 day   Kerin Salen, MD  01/05/2018, 11:03 AM  Pager [phone] If no answer or after 5 PM call [phone]

## 2018-01-05 NOTE — Anesthesia Postprocedure Evaluation (Signed)
Anesthesia Post Note  Patient: Jennifer Hunter  Procedure(s) Performed: ENDOSCOPIC RETROGRADE CHOLANGIOPANCREATOGRAPHY (ERCP) (N/A ) SPHINCTEROTOMY REMOVAL OF STONES     Patient location during evaluation: Endoscopy Anesthesia Type: General Level of consciousness: awake Pain management: pain level controlled Vital Signs Assessment: post-procedure vital signs reviewed and stable Respiratory status: spontaneous breathing Cardiovascular status: stable Postop Assessment: no apparent nausea or vomiting Anesthetic complications: no    Last Vitals:  Vitals:   01/05/18 1255 01/05/18 1300  BP: 136/66 138/65  Pulse: (!) 109 (!) 106  Resp: 18 17  Temp:    SpO2: 100% 100%    Last Pain:  Vitals:   01/05/18 1300  TempSrc:   PainSc: 0-No pain   Pain Goal:                 Jennifer Hunter,Jennifer Hunter

## 2018-01-05 NOTE — Anesthesia Procedure Notes (Signed)
Procedure Name: Intubation Date/Time: 01/05/2018 12:11 PM Performed by: Lind Covert, CRNA Pre-anesthesia Checklist: Patient identified, Emergency Drugs available, Suction available, Patient being monitored and Timeout performed Patient Re-evaluated:Patient Re-evaluated prior to induction Oxygen Delivery Method: Circle system utilized Preoxygenation: Pre-oxygenation with 100% oxygen Induction Type: IV induction Ventilation: Mask ventilation without difficulty Laryngoscope Size: Mac and 3 Grade View: Grade I Tube type: Oral Tube size: 7.0 mm Number of attempts: 1 Airway Equipment and Method: Stylet Placement Confirmation: ETT inserted through vocal cords under direct vision,  positive ETCO2 and breath sounds checked- equal and bilateral Secured at: 21 cm Tube secured with: Tape Dental Injury: Teeth and Oropharynx as per pre-operative assessment

## 2018-01-05 NOTE — Transfer of Care (Signed)
Immediate Anesthesia Transfer of Care Note  Patient: Jennifer Hunter  Procedure(s) Performed: ENDOSCOPIC RETROGRADE CHOLANGIOPANCREATOGRAPHY (ERCP) (N/A )  Patient Location: PACU  Anesthesia Type:General  Level of Consciousness: sedated  Airway & Oxygen Therapy: Patient Spontanous Breathing and Patient connected to face mask oxygen  Post-op Assessment: Report given to RN and Post -op Vital signs reviewed and stable  Post vital signs: Reviewed and stable  Last Vitals:  Vitals Value Taken Time  BP    Temp    Pulse    Resp    SpO2      Last Pain:  Vitals:   01/05/18 1139  TempSrc: Oral  PainSc: 0-No pain         Complications: No apparent anesthesia complications

## 2018-01-05 NOTE — Op Note (Signed)
ERCP was performed for abnormal liver enzymes and removal of distal common bile duct stone.  Findings: Normal-appearing ampulla. Bile duct was deeply cannulated, cholangiogram revealed a filling defect in distal common compatible with stone. 12 mm sphincterotomy performed. Several balloon sweeps with 12mm balloon. 1 CBD stone removed. Rest of the biliary tree appeared unremarkable. The pancreatic duct was never injected or cannulated during the entire procedure.   Recommendations: Surgical evaluation for cholecystectomy. Clear Liquid diet,diet to be advanced after surgical evaluation depending upon timing of cholecystectomy.   Kerin SalenArya Koty Anctil, M.D.

## 2018-01-05 NOTE — Anesthesia Preprocedure Evaluation (Signed)
Anesthesia Evaluation  Patient identified by MRN, date of birth, ID band Patient awake    Reviewed: Allergy & Precautions, NPO status , Patient's Chart, lab work & pertinent test results  Airway Mallampati: I       Dental no notable dental hx. (+) Teeth Intact   Pulmonary former smoker,    Pulmonary exam normal breath sounds clear to auscultation       Cardiovascular Normal cardiovascular exam Rhythm:Regular Rate:Normal     Neuro/Psych negative neurological ROS  negative psych ROS   GI/Hepatic Neg liver ROS,   Endo/Other  negative endocrine ROS  Renal/GU negative Renal ROS  negative genitourinary   Musculoskeletal negative musculoskeletal ROS (+)   Abdominal Normal abdominal exam  (+)   Peds  Hematology   Anesthesia Other Findings   Reproductive/Obstetrics                             Anesthesia Physical Anesthesia Plan  ASA: II  Anesthesia Plan:    Post-op Pain Management:    Induction:   PONV Risk Score and Plan: Ondansetron, Dexamethasone and Scopolamine patch - Pre-op  Airway Management Planned: Oral ETT  Additional Equipment:   Intra-op Plan:   Post-operative Plan: Extubation in OR  Informed Consent: I have reviewed the patients History and Physical, chart, labs and discussed the procedure including the risks, benefits and alternatives for the proposed anesthesia with the patient or authorized representative who has indicated his/her understanding and acceptance.     Plan Discussed with: CRNA  Anesthesia Plan Comments:         Anesthesia Quick Evaluation

## 2018-01-05 NOTE — Brief Op Note (Signed)
01/04/2018 - 01/05/2018  12:43 PM  PATIENT:  Jennifer Hunter  17 y.o. female  PRE-OPERATIVE DIAGNOSIS:  CBD stone  POST-OPERATIVE DIAGNOSIS:  bile duct stone removed  PROCEDURE:  Procedure(s): ENDOSCOPIC RETROGRADE CHOLANGIOPANCREATOGRAPHY (ERCP) (N/A)  SURGEON:  Surgeon(s) and Role:    Ronnette Juniper, MD - Primary  PHYSICIAN ASSISTANT:   ASSISTANTS: Tory Emerald, Hope Parker, Tech  ANESTHESIA:   MAC  EBL:  Minimal  BLOOD ADMINISTERED:none  DRAINS: none   LOCAL MEDICATIONS USED:  NONE  SPECIMEN:  No Specimen  DISPOSITION OF SPECIMEN:  N/A  COUNTS:  YES  TOURNIQUET:  * No tourniquets in log *  DICTATION: .Dragon Dictation  PLAN OF CARE: Admit to inpatient   PATIENT DISPOSITION:  PACU - hemodynamically stable.   Delay start of Pharmacological VTE agent (>24hrs) due to surgical blood loss or risk of bleeding: no

## 2018-01-06 ENCOUNTER — Encounter (HOSPITAL_COMMUNITY)
Admission: EM | Disposition: A | Discharge status: Home / Self Care | Payer: Self-pay | Source: Home / Self Care | Attending: Internal Medicine

## 2018-01-06 ENCOUNTER — Inpatient Hospital Stay (HOSPITAL_COMMUNITY): Payer: Medicaid Other | Admitting: Certified Registered Nurse Anesthetist

## 2018-01-06 ENCOUNTER — Encounter (HOSPITAL_COMMUNITY): Payer: Self-pay | Admitting: Anesthesiology

## 2018-01-06 HISTORY — PX: CHOLECYSTECTOMY: SHX55

## 2018-01-06 LAB — COMPREHENSIVE METABOLIC PANEL
ALK PHOS: 132 U/L — AB (ref 38–126)
ALT: 393 U/L — ABNORMAL HIGH (ref 14–54)
ANION GAP: 8 (ref 5–15)
AST: 163 U/L — ABNORMAL HIGH (ref 15–41)
Albumin: 3.9 g/dL (ref 3.5–5.0)
BUN: 7 mg/dL (ref 6–20)
CALCIUM: 9.4 mg/dL (ref 8.9–10.3)
CO2: 26 mmol/L (ref 22–32)
Chloride: 108 mmol/L (ref 101–111)
Creatinine, Ser: 0.51 mg/dL (ref 0.44–1.00)
GLUCOSE: 108 mg/dL — AB (ref 65–99)
POTASSIUM: 4.1 mmol/L (ref 3.5–5.1)
Sodium: 142 mmol/L (ref 135–145)
TOTAL PROTEIN: 7.1 g/dL (ref 6.5–8.1)
Total Bilirubin: 0.9 mg/dL (ref 0.3–1.2)

## 2018-01-06 LAB — CBC WITH DIFFERENTIAL/PLATELET
BASOS PCT: 0 %
Basophils Absolute: 0 10*3/uL (ref 0.0–0.1)
Eosinophils Absolute: 0 10*3/uL (ref 0.0–0.7)
Eosinophils Relative: 0 %
HEMATOCRIT: 38.6 % (ref 36.0–46.0)
HEMOGLOBIN: 13 g/dL (ref 12.0–15.0)
LYMPHS ABS: 1.8 10*3/uL (ref 0.7–4.0)
LYMPHS PCT: 26 %
MCH: 30 pg (ref 26.0–34.0)
MCHC: 33.7 g/dL (ref 30.0–36.0)
MCV: 89.1 fL (ref 78.0–100.0)
MONO ABS: 0.8 10*3/uL (ref 0.1–1.0)
MONOS PCT: 12 %
NEUTROS ABS: 4.1 10*3/uL (ref 1.7–7.7)
NEUTROS PCT: 62 %
Platelets: 278 10*3/uL (ref 150–400)
RBC: 4.33 MIL/uL (ref 3.87–5.11)
RDW: 14.2 % (ref 11.5–15.5)
WBC: 6.7 10*3/uL (ref 4.0–10.5)

## 2018-01-06 LAB — LIPASE, BLOOD: Lipase: 27 U/L (ref 11–51)

## 2018-01-06 LAB — PHOSPHORUS: Phosphorus: 3.2 mg/dL (ref 2.5–4.6)

## 2018-01-06 LAB — MAGNESIUM: MAGNESIUM: 1.7 mg/dL (ref 1.7–2.4)

## 2018-01-06 LAB — HEMOGLOBIN A1C
HEMOGLOBIN A1C: 5 % (ref 4.8–5.6)
MEAN PLASMA GLUCOSE: 96.8 mg/dL

## 2018-01-06 SURGERY — LAPAROSCOPIC CHOLECYSTECTOMY WITH INTRAOPERATIVE CHOLANGIOGRAM
Anesthesia: General | Site: Abdomen

## 2018-01-06 MED ORDER — SENNOSIDES-DOCUSATE SODIUM 8.6-50 MG PO TABS
1.0000 | ORAL_TABLET | Freq: Two times a day (BID) | ORAL | Status: DC
  Administered 2018-01-06 – 2018-01-07 (×2): 1 via ORAL
  Filled 2018-01-06 (×3): qty 1

## 2018-01-06 MED ORDER — HYDROMORPHONE HCL 1 MG/ML IJ SOLN
INTRAMUSCULAR | Status: AC
  Filled 2018-01-06: qty 2

## 2018-01-06 MED ORDER — BUPIVACAINE-EPINEPHRINE (PF) 0.25% -1:200000 IJ SOLN
INTRAMUSCULAR | Status: AC
Start: 2018-01-06 — End: ?
  Filled 2018-01-06: qty 30

## 2018-01-06 MED ORDER — LACTATED RINGERS IR SOLN
Status: DC | PRN
  Administered 2018-01-06: 1000 mL

## 2018-01-06 MED ORDER — ONDANSETRON HCL 4 MG/2ML IJ SOLN
INTRAMUSCULAR | Status: AC
  Filled 2018-01-06: qty 2

## 2018-01-06 MED ORDER — DEXAMETHASONE SODIUM PHOSPHATE 10 MG/ML IJ SOLN
INTRAMUSCULAR | Status: DC | PRN
  Administered 2018-01-06: 10 mg via INTRAVENOUS

## 2018-01-06 MED ORDER — MIDAZOLAM HCL 2 MG/2ML IJ SOLN
INTRAMUSCULAR | Status: AC
  Filled 2018-01-06: qty 2

## 2018-01-06 MED ORDER — LIDOCAINE 2% (20 MG/ML) 5 ML SYRINGE
INTRAMUSCULAR | Status: AC
  Filled 2018-01-06: qty 5

## 2018-01-06 MED ORDER — HYDROMORPHONE HCL 1 MG/ML IJ SOLN
0.2500 mg | INTRAMUSCULAR | Status: DC | PRN
  Administered 2018-01-06 (×2): 0.5 mg via INTRAVENOUS

## 2018-01-06 MED ORDER — BUPIVACAINE-EPINEPHRINE (PF) 0.25% -1:200000 IJ SOLN
INTRAMUSCULAR | Status: DC | PRN
  Administered 2018-01-06: 10 mL

## 2018-01-06 MED ORDER — ONDANSETRON HCL 4 MG/2ML IJ SOLN
INTRAMUSCULAR | Status: DC | PRN
  Administered 2018-01-06: 4 mg via INTRAVENOUS

## 2018-01-06 MED ORDER — LIDOCAINE HCL (PF) 1 % IJ SOLN
INTRAMUSCULAR | Status: AC
Start: 2018-01-06 — End: ?
  Filled 2018-01-06: qty 30

## 2018-01-06 MED ORDER — LIDOCAINE 2% (20 MG/ML) 5 ML SYRINGE
INTRAMUSCULAR | Status: DC | PRN
  Administered 2018-01-06: 80 mg via INTRAVENOUS

## 2018-01-06 MED ORDER — MEPERIDINE HCL 50 MG/ML IJ SOLN: 6.2500 mg | INTRAMUSCULAR | Status: DC | PRN

## 2018-01-06 MED ORDER — 0.9 % SODIUM CHLORIDE (POUR BTL) OPTIME
TOPICAL | Status: DC | PRN
  Administered 2018-01-06: 1000 mL

## 2018-01-06 MED ORDER — LIDOCAINE HCL (PF) 1 % IJ SOLN
INTRAMUSCULAR | Status: DC | PRN
  Administered 2018-01-06: 10 mL

## 2018-01-06 MED ORDER — METOCLOPRAMIDE HCL 5 MG/ML IJ SOLN: 10.0000 mg | Freq: Once | INTRAMUSCULAR | Status: DC | PRN

## 2018-01-06 MED ORDER — SUGAMMADEX SODIUM 200 MG/2ML IV SOLN
INTRAVENOUS | Status: DC | PRN
  Administered 2018-01-06: 200 mg via INTRAVENOUS

## 2018-01-06 MED ORDER — MIDAZOLAM HCL 5 MG/5ML IJ SOLN
INTRAMUSCULAR | Status: DC | PRN
  Administered 2018-01-06: 2 mg via INTRAVENOUS

## 2018-01-06 MED ORDER — HYDROMORPHONE HCL 1 MG/ML IJ SOLN
0.5000 mg | INTRAMUSCULAR | Status: DC | PRN
Start: 2018-01-06 — End: 2018-01-07
  Administered 2018-01-06 (×2): 0.5 mg via INTRAVENOUS
  Filled 2018-01-06 (×2): qty 1

## 2018-01-06 MED ORDER — CELECOXIB 200 MG PO CAPS
200.0000 mg | ORAL_CAPSULE | Freq: Two times a day (BID) | ORAL | Status: DC
  Administered 2018-01-06 – 2018-01-07 (×2): 200 mg via ORAL
  Filled 2018-01-06 (×3): qty 1

## 2018-01-06 MED ORDER — PROPOFOL 10 MG/ML IV BOLUS
INTRAVENOUS | Status: AC
  Filled 2018-01-06: qty 20

## 2018-01-06 MED ORDER — FENTANYL CITRATE (PF) 100 MCG/2ML IJ SOLN
INTRAMUSCULAR | Status: AC
  Filled 2018-01-06: qty 2

## 2018-01-06 MED ORDER — PROPOFOL 10 MG/ML IV BOLUS
INTRAVENOUS | Status: DC | PRN
  Administered 2018-01-06: 150 mg via INTRAVENOUS

## 2018-01-06 MED ORDER — DEXAMETHASONE SODIUM PHOSPHATE 10 MG/ML IJ SOLN
INTRAMUSCULAR | Status: AC
  Filled 2018-01-06: qty 1

## 2018-01-06 MED ORDER — GABAPENTIN 300 MG PO CAPS
300.0000 mg | ORAL_CAPSULE | Freq: Two times a day (BID) | ORAL | Status: DC
  Administered 2018-01-06 – 2018-01-07 (×3): 300 mg via ORAL
  Filled 2018-01-06 (×4): qty 1

## 2018-01-06 MED ORDER — HYDROMORPHONE HCL 1 MG/ML IJ SOLN: 1.0000 mg | Freq: Once | INTRAMUSCULAR | Status: DC

## 2018-01-06 MED ORDER — ESMOLOL HCL 100 MG/10ML IV SOLN
INTRAVENOUS | Status: DC | PRN
  Administered 2018-01-06 (×2): 20 mg via INTRAVENOUS

## 2018-01-06 MED ORDER — IOPAMIDOL (ISOVUE-300) INJECTION 61%
INTRAVENOUS | Status: AC
  Filled 2018-01-06: qty 50

## 2018-01-06 MED ORDER — ROCURONIUM BROMIDE 50 MG/5ML IV SOSY
PREFILLED_SYRINGE | INTRAVENOUS | Status: DC | PRN
  Administered 2018-01-06: 10 mg via INTRAVENOUS
  Administered 2018-01-06: 50 mg via INTRAVENOUS

## 2018-01-06 MED ORDER — LACTATED RINGERS IV SOLN
INTRAVENOUS | Status: DC | PRN
  Administered 2018-01-06 (×2): via INTRAVENOUS

## 2018-01-06 MED ORDER — FENTANYL CITRATE (PF) 100 MCG/2ML IJ SOLN
INTRAMUSCULAR | Status: DC | PRN
  Administered 2018-01-06 (×7): 50 ug via INTRAVENOUS

## 2018-01-06 MED ORDER — FENTANYL CITRATE (PF) 250 MCG/5ML IJ SOLN
INTRAMUSCULAR | Status: AC
  Filled 2018-01-06: qty 5

## 2018-01-06 SURGICAL SUPPLY — 43 items
ADH SKN CLS APL DERMABOND .7 (GAUZE/BANDAGES/DRESSINGS) ×1
APPLIER CLIP ROT 10 11.4 M/L (STAPLE) ×3
APR CLP MED LRG 11.4X10 (STAPLE) ×1
BAG SPEC RTRVL LRG 6X4 10 (ENDOMECHANICALS) ×1
CABLE HIGH FREQUENCY MONO STRZ (ELECTRODE) ×3 IMPLANT
CHLORAPREP W/TINT 26ML (MISCELLANEOUS) ×3 IMPLANT
CLIP APPLIE ROT 10 11.4 M/L (STAPLE) ×1 IMPLANT
CLIP VESOLOCK MED LG 6/CT (CLIP) IMPLANT
COVER MAYO STAND STRL (DRAPES) IMPLANT
COVER SURGICAL LIGHT HANDLE (MISCELLANEOUS) ×3 IMPLANT
DECANTER SPIKE VIAL GLASS SM (MISCELLANEOUS) ×3 IMPLANT
DERMABOND ADVANCED (GAUZE/BANDAGES/DRESSINGS) ×2
DERMABOND ADVANCED .7 DNX12 (GAUZE/BANDAGES/DRESSINGS) IMPLANT
DRAPE C-ARM 42X120 X-RAY (DRAPES) IMPLANT
ELECT L-HOOK LAP 45CM DISP (ELECTROSURGICAL)
ELECT PENCIL ROCKER SW 15FT (MISCELLANEOUS) ×3 IMPLANT
ELECT REM PT RETURN 15FT ADLT (MISCELLANEOUS) ×3 IMPLANT
ELECTRODE L-HOOK LAP 45CM DISP (ELECTROSURGICAL) IMPLANT
ENDOLOOP SUT PDS II  0 18 (SUTURE) ×2
ENDOLOOP SUT PDS II 0 18 (SUTURE) IMPLANT
GLOVE BIO SURGEON STRL SZ 6 (GLOVE) ×3 IMPLANT
GLOVE INDICATOR 6.5 STRL GRN (GLOVE) ×3 IMPLANT
GOWN STRL REUS W/TWL 2XL LVL3 (GOWN DISPOSABLE) ×3 IMPLANT
GOWN STRL REUS W/TWL XL LVL3 (GOWN DISPOSABLE) ×6 IMPLANT
HEMOSTAT SNOW SURGICEL 2X4 (HEMOSTASIS) IMPLANT
KIT BASIN OR (CUSTOM PROCEDURE TRAY) ×3 IMPLANT
L-HOOK LAP DISP 36CM (ELECTROSURGICAL) ×3
LHOOK LAP DISP 36CM (ELECTROSURGICAL) IMPLANT
POSITIONER SURGICAL ARM (MISCELLANEOUS) IMPLANT
POUCH SPECIMEN RETRIEVAL 10MM (ENDOMECHANICALS) ×3 IMPLANT
SCISSORS LAP 5X35 DISP (ENDOMECHANICALS) ×3 IMPLANT
SET CHOLANGIOGRAPH MIX (MISCELLANEOUS) IMPLANT
SET IRRIG TUBING LAPAROSCOPIC (IRRIGATION / IRRIGATOR) ×3 IMPLANT
SLEEVE XCEL OPT CAN 5 100 (ENDOMECHANICALS) ×3 IMPLANT
SUT MNCRL AB 4-0 PS2 18 (SUTURE) ×3 IMPLANT
TAPE CLOTH 4X10 WHT NS (GAUZE/BANDAGES/DRESSINGS) IMPLANT
TOWEL OR 17X26 10 PK STRL BLUE (TOWEL DISPOSABLE) ×3 IMPLANT
TOWEL OR NON WOVEN STRL DISP B (DISPOSABLE) ×3 IMPLANT
TRAY LAPAROSCOPIC (CUSTOM PROCEDURE TRAY) ×3 IMPLANT
TROCAR BLADELESS OPT 5 100 (ENDOMECHANICALS) ×3 IMPLANT
TROCAR XCEL BLUNT TIP 100MML (ENDOMECHANICALS) ×3 IMPLANT
TROCAR XCEL NON-BLD 11X100MML (ENDOMECHANICALS) ×3 IMPLANT
TUBING INSUF HEATED (TUBING) ×2 IMPLANT

## 2018-01-06 NOTE — Anesthesia Procedure Notes (Signed)
Procedure Name: Intubation Date/Time: 01/06/2018 11:53 AM Performed by: West Pugh, CRNA Pre-anesthesia Checklist: Patient identified, Emergency Drugs available, Suction available, Patient being monitored and Timeout performed Patient Re-evaluated:Patient Re-evaluated prior to induction Oxygen Delivery Method: Circle system utilized Preoxygenation: Pre-oxygenation with 100% oxygen Induction Type: IV induction Ventilation: Mask ventilation without difficulty Laryngoscope Size: Mac and 3 Grade View: Grade I Tube type: Oral Tube size: 7.0 mm Number of attempts: 1 Airway Equipment and Method: Stylet Placement Confirmation: ETT inserted through vocal cords under direct vision,  positive ETCO2,  CO2 detector and breath sounds checked- equal and bilateral Secured at: 20 cm Tube secured with: Tape Dental Injury: Teeth and Oropharynx as per pre-operative assessment

## 2018-01-06 NOTE — Progress Notes (Signed)
PROGRESS NOTE    Jennifer PortsJennifer Hunter  ZOX:[id number] DOB: 02/23/2001 DOA: 01/04/2018 PCP: Patient, No Pcp Per   Brief Narrative:  Jennifer PortsJennifer Hunter is a 17 y.o. female with no previous significant medical history who presents with 5-day history of right upper quadrant abdominal pain.  She stated that eating makes it worse and describes it as a sharp pain that has been constant in nature.  She admits to poor p.o. intake, nausea and vomiting as well.  Denies any fevers at home, chest pain, shortness of breath, cough, diarrhea, dysuria.  She notes that she has had this previously during pregnancy which resolved without intervention. In the ED, Labs were obtained which revealed elevated liver enzyme AST 220, ALT 226.  Right upper quadrant ultrasound revealed choledocholithiasis with dilated common bile duct 9 mm. She was admitted for Suspected Choledocholithiasis and GI Consulted. Underwent MRCP and ERCP and found to have biliary stone and choledocholithiasis that was removed with biliary sphincterotomy and General Surgery consulted for Cholecystectomy and is likely to be done this AM at 11:00.   Assessment & Plan:   Principal Problem:   Choledocholithiasis Active Problems:   Elevated LFTs  Abdominal Pain from Choledocholithiasis -US RUQ: Cholelithiasis is noted without evidence of cholecystitis. Common bile duct dilatation 9mm  -MRCP done and showed Cholelithiasis without evidence of cholecystitis; Also showed Borderline extrahepatic biliary dilatation, slightly improved from earlier ultrasound. Questionable finding in the distal common bile duct on one series only without definite choledocholithiasis. Improving LFT elevation suggests possible recent passage of a small stone -GI consulted and recommended NPO at this time and proceeding with ERCP  -ERCP showed a filling defect consistent with a stone seen on the cholangiogram and choledocholithiasis was found and compelte removal was done by  biliary sphincterotomy and balloon extraction along with biliary sweeping -GI also recommending Surgical Consultation for Cholecystectomy and Dr. Donell BeersByerly notified by Dr. Marca AnconaKarki -General Surgery to take the patient for Cholecystectomy this AM and she was NPO at midnight; Surgical Prophylaxis Abx given with Cefazolin -Pain Control with Hydromorphone 1 mg q3hprn Moderate Pain and Oxycodone 5 mg po q4hprn -Antiemetics with Ondansetron 4 mg po/IV q6hprn Nausea  -Was started on IVF and given 2 Liter LR boluses and then started on Maintenance Fluid with NS at 150 mL/hr and will continue  Abnormal LFT's/Elevated LFT's in the setting of Choledocholithiasis  -AST was trending down and went from 220 -> 85 but now trended back up to 163  -ALT was trending down and went from 470 -> 307 but now trended up and went to 393 -MRCP done and as above -ERCP done and showed biliary stone and choledocholithiasis. -C/w IVF as above  -Continue to Monitor and Trend LFT's and repeat CMP in AM   Hyperglycemia -Likely Reactive; CMP's have showed Blood Glucose ranging from 83-116 -Checking HbA1c and is still pending  -If consistently elevated will consider adding Sensitive Novolog SSI  DVT prophylaxis: Enoxaparin 40 mg sq q24h  Code Status: FULL CODE Family Communication: No family present at bedside  Disposition Plan: Anticipate D/C in next 24-48 hours after Cholecystectomy   Consultants:  [facility] General Surgery  Procedures:  ERCP Findings:      The scout film was normal. The esophagus was successfully intubated       under direct vision. The scope was advanced to a normal major papilla in       the descending duodenum without detailed examination of the pharynx,       larynx and  associated structures, and upper GI tract. The upper GI tract       was grossly normal. The bile duct was deeply cannulated with the       sphincterotome. Contrast was injected. I personally interpreted the bile        duct images. There was brisk flow of contrast through the ducts. Image       quality was excellent. Contrast extended to the entire biliary tree. The       lower third of the main bile duct contained filling defect(s) thought to       be a stone. The biliary tree was otherwise normal. A long 0.035 inch       Stiff Jagwire was passed into the biliary tree. A 12 mm biliary       sphincterotomy was made with a braided sphincterotome using ERBE       electrocautery. There was no post-sphincterotomy bleeding. The biliary       tree was swept with a 12 mm balloon starting at the bifurcation. One       stone was removed. No stones remained.      The pancreatic duct was never canulated or injected during the procedure. Impression:               - A filling defect consistent with a stone was seen                            on the cholangiogram.                           - Choledocholithiasis was found. Complete removal                            was accomplished by biliary sphincterotomy and                            balloon extraction.                           - A biliary sphincterotomy was performed.                           - The biliary tree was swept.     Antimicrobials:  Anti-infectives (From admission, onward)   Start     Dose/Rate Route Frequency Ordered Stop   01/06/18 0600  ceFAZolin (ANCEF) IVPB 2g/100 mL premix     2 g 200 mL/hr over 30 Minutes Intravenous On call to O.R. 01/05/18 1445 01/07/18 0559     Subjective: Seen and examined at bedside and was feeling okay and slept well.  Denies any chest pain, shortness breath, nausea, vomiting.  Denies any abdominal pain at this time.  Is to go for a laparoscopic cholecystectomy around 11 AM this morning.  No other complaints or concerns at this time  Objective: Vitals:   01/05/18 1400 01/05/18 1410 01/05/18 2202 01/06/18 0640  BP: 138/87 (!) 122/59 122/73 112/66  Pulse: 78 77 75 (!) 59  Resp: 13 19 18 16   Temp:   98.8 F (37.1  C) 98 F (36.7 C)  TempSrc:   Oral Oral  SpO2: 98% 98% 100% 99%  Weight:      Height:  Intake/Output Summary (Last 24 hours) at 01/06/2018 0742 Last data filed at 01/05/2018 1800 Gross per 24 hour  Intake 2618.75 ml  Output -  Net 2618.75 ml   Filed Weights   01/04/18 1800 01/05/18 1139  Weight: 60.8 kg (134 lb) 60.8 kg (134 lb)   Examination: Physical Exam:  Constitutional: Well-nourished, well-developed female currently no acute distress who is awaiting laparoscopic cholecystectomy Eyes: Sclera anicteric.  Lids and conjunctive are normal ENMT: External ears and nose appear normal. Neck: Supple with no JVD Respiratory: lear to auscultation bilaterally with no appreciable wheezing, rales, rhonchi.  Unlabored breathing and is not using any accessory muscles to breathe Cardiovascular: Regular rate and rhythm and slightly bradycardic.  No appreciable murmurs, rubs, gallops.  No lower extremity edema Abdomen: Soft, nontender, nondistended.  Bowel sounds present all 4 quadrants and slightly hyperactive GU: Deferred Musculoskeletal: No contractures or cyanosis.  No joint deformities noted Skin: Skin is warm and dry with no appreciable rashes or lesions on limited skin evaluation Neurologic: Cranial nerves II through XII grossly intact with no appreciable focal deficits. Psychiatric: Mood and affect.  Intact judgment and insight.  Patient is awake and alert and oriented x3.  Data Reviewed: I have personally reviewed following labs and imaging studies  CBC: Recent Labs  Lab 01/02/18 1538 01/04/18 1210 01/05/18 0430 01/06/18 0511  WBC 10.4 5.3 4.4 6.7  NEUTROABS  --   --   --  4.1  HGB 13.9 13.4 12.8 13.0  HCT 42.8 40.3 38.6 38.6  MCV 91.1 92.0 90.6 89.1  PLT 313 289 254 278   Basic Metabolic Panel: Recent Labs  Lab 01/02/18 1538 01/04/18 1210 01/05/18 0430 01/06/18 0511  NA 143 140 141 142  K 3.7 3.8 4.2 4.1  CL 104 104 108 108  CO2 30 31 28 26   GLUCOSE 116*  106* 83 108*  BUN 13 9 9 7   CREATININE 0.65 0.58 0.61 0.51  CALCIUM 9.8 9.3 9.1 9.4  MG  --   --   --  1.7  PHOS  --   --   --  3.2   GFR: Estimated Creatinine Clearance: 97.1 mL/min (by C-G formula based on SCr of 0.51 mg/dL). Liver Function Tests: Recent Labs  Lab 01/02/18 1538 01/04/18 1210 01/05/18 0430 01/06/18 0511  AST 220* 185* 85* 163*  ALT 226* 470* 307* 393*  ALKPHOS 107 138* 114 132*  BILITOT 1.1 0.8 0.9 0.9  PROT 7.8 7.7 6.8 7.1  ALBUMIN 4.2 4.4 3.8 3.9   Recent Labs  Lab 01/02/18 1538 01/04/18 1210 01/06/18 0511  LIPASE 29 29 27    No results for input(s): AMMONIA in the last 168 hours. Coagulation Profile: No results for input(s): INR, PROTIME in the last 168 hours. Cardiac Enzymes: No results for input(s): CKTOTAL, CKMB, CKMBINDEX, TROPONINI in the last 168 hours. BNP (last 3 results) No results for input(s): PROBNP in the last 8760 hours. HbA1C: No results for input(s): HGBA1C in the last 72 hours. CBG: No results for input(s): GLUCAP in the last 168 hours. Lipid Profile: No results for input(s): CHOL, HDL, LDLCALC, TRIG, CHOLHDL, LDLDIRECT in the last 72 hours. Thyroid Function Tests: No results for input(s): TSH, T4TOTAL, FREET4, T3FREE, THYROIDAB in the last 72 hours. Anemia Panel: No results for input(s): VITAMINB12, FOLATE, FERRITIN, TIBC, IRON, RETICCTPCT in the last 72 hours. Sepsis Labs: No results for input(s): PROCALCITON, LATICACIDVEN in the last 168 hours.  No results found for this or any previous visit (from the past  240 hour(s)).   Radiology Studies: Mr 3d Recon At Scanner  Result Date: 01/05/2018 CLINICAL DATA:  Acute right upper quadrant abdominal pain for 3 days. Elevated liver function studies. Evaluate for choledocholithiasis. EXAM: MRI ABDOMEN WITHOUT AND WITH CONTRAST (INCLUDING MRCP) TECHNIQUE: Multiplanar multisequence MR imaging of the abdomen was performed both before and after the administration of intravenous contrast.  Heavily T2-weighted images of the biliary and pancreatic ducts were obtained, and three-dimensional MRCP images were rendered by post processing. CONTRAST:  12mL MULTIHANCE GADOBENATE DIMEGLUMINE 529 MG/ML IV SOLN COMPARISON:  Abdominal ultrasound 01/04/2018. FINDINGS: Lower chest:  The visualized lower chest appears unremarkable. Hepatobiliary: The liver is normal in signal without steatosis, focal lesion or abnormal enhancement. There is a 12 mm gallstone. There is no gallbladder wall thickening or surrounding inflammation. There is no intrahepatic biliary dilatation. Extrahepatic biliary dilatation has slightly improved from earlier ultrasound. The common hepatic duct measures 8 mm maximally. No definite filling defects within the common bile duct on the thin section MRCP images. On coronal image 18 of series 7, there is a questionable tiny filling defect, although there is no corresponding abnormality on the other sequences. Pancreas: Unremarkable. No pancreatic ductal dilatation or surrounding inflammatory changes. No pancreas divisum. Spleen: Normal in size without focal abnormality. Adrenals/Urinary Tract: Both adrenal glands appear normal. Tiny cyst in the interpolar region of the right kidney. The kidneys otherwise appear normal. Stomach/Bowel: No evidence of bowel wall thickening, distention or surrounding inflammatory change. Vascular/Lymphatic: There are no enlarged abdominal lymph nodes. No significant vascular findings. Other: No ascites. Musculoskeletal: No acute or significant osseous findings. IMPRESSION: 1. Cholelithiasis without evidence of cholecystitis. 2. Borderline extrahepatic biliary dilatation, slightly improved from earlier ultrasound. Questionable finding in the distal common bile duct on one series only without definite choledocholithiasis. Improving LFT elevation suggests possible recent passage of a small stone. Electronically Signed   By: Carey Bullocks M.D.   On: 01/05/2018 10:31    Dg Ercp Biliary & Pancreatic Ducts  Result Date: 01/05/2018 CLINICAL DATA:  Gallstones EXAM: ERCP TECHNIQUE: Multiple spot images obtained with the fluoroscopic device and submitted for interpretation post-procedure. FLUOROSCOPY TIME:  Fluoroscopy Time:  2 minutes and 50 seconds Radiation Exposure Index (if provided by the fluoroscopic device): Number of Acquired Spot Images: 0 COMPARISON:  None. FINDINGS: Contrast fills the biliary tree. There are no filling defects in the common bile duct. IMPRESSION: See above. These images were submitted for radiologic interpretation only. Please see the procedural report for the amount of contrast and the fluoroscopy time utilized. Electronically Signed   By: Jolaine Click M.D.   On: 01/05/2018 14:10   Mr Abdomen Mrcp W Wo Contast  Result Date: 01/05/2018 CLINICAL DATA:  Acute right upper quadrant abdominal pain for 3 days. Elevated liver function studies. Evaluate for choledocholithiasis. EXAM: MRI ABDOMEN WITHOUT AND WITH CONTRAST (INCLUDING MRCP) TECHNIQUE: Multiplanar multisequence MR imaging of the abdomen was performed both before and after the administration of intravenous contrast. Heavily T2-weighted images of the biliary and pancreatic ducts were obtained, and three-dimensional MRCP images were rendered by post processing. CONTRAST:  12mL MULTIHANCE GADOBENATE DIMEGLUMINE 529 MG/ML IV SOLN COMPARISON:  Abdominal ultrasound 01/04/2018. FINDINGS: Lower chest:  The visualized lower chest appears unremarkable. Hepatobiliary: The liver is normal in signal without steatosis, focal lesion or abnormal enhancement. There is a 12 mm gallstone. There is no gallbladder wall thickening or surrounding inflammation. There is no intrahepatic biliary dilatation. Extrahepatic biliary dilatation has slightly improved from earlier ultrasound. The common  hepatic duct measures 8 mm maximally. No definite filling defects within the common bile duct on the thin section MRCP images.  On coronal image 18 of series 7, there is a questionable tiny filling defect, although there is no corresponding abnormality on the other sequences. Pancreas: Unremarkable. No pancreatic ductal dilatation or surrounding inflammatory changes. No pancreas divisum. Spleen: Normal in size without focal abnormality. Adrenals/Urinary Tract: Both adrenal glands appear normal. Tiny cyst in the interpolar region of the right kidney. The kidneys otherwise appear normal. Stomach/Bowel: No evidence of bowel wall thickening, distention or surrounding inflammatory change. Vascular/Lymphatic: There are no enlarged abdominal lymph nodes. No significant vascular findings. Other: No ascites. Musculoskeletal: No acute or significant osseous findings. IMPRESSION: 1. Cholelithiasis without evidence of cholecystitis. 2. Borderline extrahepatic biliary dilatation, slightly improved from earlier ultrasound. Questionable finding in the distal common bile duct on one series only without definite choledocholithiasis. Improving LFT elevation suggests possible recent passage of a small stone. Electronically Signed   By: Carey Bullocks M.D.   On: 01/05/2018 10:31   US Abdomen Limited Ruq  Result Date: 01/04/2018 CLINICAL DATA:  Acute right upper quadrant abdominal pain. EXAM: ULTRASOUND ABDOMEN LIMITED RIGHT UPPER QUADRANT COMPARISON:  None. FINDINGS: Gallbladder: Large gallstone measuring 1.9 cm is noted. No gallbladder wall thickening or pericholecystic fluid is noted. No sonographic Murphy's sign is noted. Sludge is noted within gallbladder lumen is well. Common bile duct: Diameter: Dilated at 9 mm. Probable large stone is noted in distal common bile duct. Liver: No focal lesion identified. Within normal limits in parenchymal echogenicity. Portal vein is patent on color Doppler imaging with normal direction of blood flow towards the liver. IMPRESSION: Cholelithiasis is noted without evidence of cholecystitis. Common bile duct dilatation  is noted due to stone or stones in distal common bile duct consistent with choledocholithiasis. Further evaluation with MRCP or CT scan is recommended. Electronically Signed   By: Lupita Raider, M.D.   On: 01/04/2018 14:36   Scheduled Meds: . enoxaparin (LOVENOX) injection  40 mg Subcutaneous Q24H   Continuous Infusions: . sodium chloride 150 mL/hr at 01/06/18 0626  .  ceFAZolin (ANCEF) IV    . lactated ringers      LOS: 2 days   Merlene Laughter, DO [facility] Pager [phone]  If 7PM-7AM, please contact night-coverage [email or web address] Password [facility] 01/06/2018, 7:42 AM

## 2018-01-06 NOTE — Anesthesia Postprocedure Evaluation (Signed)
Anesthesia Post Note  Patient: Jennifer DikeJennifer Cruz-Granados  Procedure(s) Performed: LAPAROSCOPIC CHOLECYSTECTOMY (N/A Abdomen)     Patient location during evaluation: PACU Anesthesia Type: General Level of consciousness: awake and alert and oriented Pain management: pain level controlled Vital Signs Assessment: post-procedure vital signs reviewed and stable Respiratory status: spontaneous breathing, nonlabored ventilation and respiratory function stable Cardiovascular status: blood pressure returned to baseline and stable Postop Assessment: no apparent nausea or vomiting Anesthetic complications: no    Last Vitals:  Vitals:   01/06/18 1330 01/06/18 1345  BP: (!) 142/97 (!) 141/98  Pulse: 96 93  Resp: 17 15  Temp: 36.5 C   SpO2: 100% 100%    Last Pain:  Vitals:   01/06/18 1345  TempSrc:   PainSc: Asleep                 Azeem Poorman A.

## 2018-01-06 NOTE — Op Note (Signed)
Laparoscopic Cholecystectomy  Indications: This patient presents with choledocholithiasis and will undergo laparoscopic cholecystectomy.  She is s/p ERCP yesterday with stone retrieval and sphincterotomy.    Pre-operative Diagnosis: choledocholithiasis  Post-operative Diagnosis: Same  Surgeon: Almond LintBYERLY,Kharter Sestak   Assistants: n/a  Anesthesia: General endotracheal anesthesia and local  ASA Class: 2  Procedure Details  The patient was seen again in the Holding Room. The risks, benefits, complications, treatment options, and expected outcomes were discussed with the patient. The possibilities of  bleeding, recurrent infection, damage to nearby structures, the need for additional procedures, failure to diagnose a condition, the possible need to convert to an open procedure, and creating a complication requiring transfusion or operation were discussed with the patient. The likelihood of improving the patient's symptoms with return to their baseline status is good.    The patient and/or family concurred with the proposed plan, giving informed consent. The site of surgery properly noted. The patient was taken to Operating Room, and the procedure verified as Laparoscopic Cholecystectomy with Intraoperative Cholangiogram. A Time Out was held and the above information confirmed.  Prior to the induction of general anesthesia, antibiotic prophylaxis was administered. General endotracheal anesthesia was then administered and tolerated well. After the induction, the abdomen was prepped with Chloraprep and draped in the sterile fashion. The patient was positioned in the supine position.  Local anesthetic agent was injected into the skin near the umbilicus and an incision made. We dissected down to the abdominal fascia with blunt dissection.  The fascia was incised vertically and we entered the peritoneal cavity bluntly.  A pursestring suture of 0-Vicryl was placed around the fascial opening.  The Hasson cannula  was inserted and secured with the stay suture.  Pneumoperitoneum was then created with CO2 and tolerated well without any adverse changes in the patient's vital signs. An 11-mm port was placed in the subxiphoid position.  Two 5-mm ports were placed in the right upper quadrant. All skin incisions were infiltrated with a local anesthetic agent before making the incision and placing the trocars.   We positioned the patient in reverse Trendelenburg, tilted slightly to the patient's left.  The gallbladder was identified, the fundus grasped and retracted cephalad. Adhesions were lysed bluntly and with the electrocautery where indicated, taking care not to injure any adjacent organs or viscus. The infundibulum was grasped and retracted laterally, exposing the peritoneum overlying the triangle of Calot. This was then divided and exposed in a blunt fashion.  The cystic duct was clearly identified and bluntly dissected circumferentially.  There was a slightly enlarged node of Calot.  The cystic artery was identified, dissected free, ligated with clips and divided. The cystic duct was dilated consistent with recent passage of stone.    The cystic duct was then ligated with clips and divided. Four clips were placed proximally as well as an PDS endoloop.  The gallbladder was dissected from the liver bed in retrograde fashion with the electrocautery. The gallbladder was removed and placed in an Endocatch bag.  The gallbladder and Endocatch bag were then removed through the umbilical port site.  The liver bed was irrigated and inspected. Hemostasis was achieved with the electrocautery. Copious irrigation was utilized and was repeatedly aspirated until clear.    We again inspected the right upper quadrant for hemostasis.  Pneumoperitoneum was released as we removed the trocars.   The pursestring suture was used to close the umbilical fascia.  4-0 Monocryl was used to close the skin.   The skin was  cleaned and dry, and  Dermabond was applied. The patient was then extubated and brought to the recovery room in stable condition. Instrument, sponge, and needle counts were correct at closure and at the conclusion of the case.   Findings: Mild acute inflammation.    Estimated Blood Loss: min         Drains: none          Specimens: Gallbladder to pathology       Complications: None; patient tolerated the procedure well.         Disposition: PACU - hemodynamically stable.         Condition: stable

## 2018-01-06 NOTE — Transfer of Care (Signed)
Immediate Anesthesia Transfer of Care Note  Patient: Jennifer DikeJennifer Cruz-Granados  Procedure(s) Performed: LAPAROSCOPIC CHOLECYSTECTOMY (N/A Abdomen)  Patient Location: PACU  Anesthesia Type:General  Level of Consciousness: awake, oriented and patient cooperative  Airway & Oxygen Therapy: Patient Spontanous Breathing and Patient connected to face mask oxygen  Post-op Assessment: Report given to RN and Post -op Vital signs reviewed and stable  Post vital signs: Reviewed and stable  Last Vitals:  Vitals Value Taken Time  BP    Temp    Pulse 113 01/06/2018  1:08 PM  Resp 16 01/06/2018  1:08 PM  SpO2 100 % 01/06/2018  1:08 PM  Vitals shown include unvalidated device data.  Last Pain:  Vitals:   01/06/18 1003  TempSrc: Oral  PainSc:          Complications: No apparent anesthesia complications

## 2018-01-06 NOTE — Anesthesia Preprocedure Evaluation (Addendum)
Anesthesia Evaluation  Patient identified by MRN, date of birth, ID band Patient awake    Reviewed: Allergy & Precautions, NPO status , Patient's Chart, lab work & pertinent test results  Airway Mallampati: II  TM Distance: >3 FB Neck ROM: Full    Dental no notable dental hx. (+) Teeth Intact   Pulmonary former smoker,    Pulmonary exam normal breath sounds clear to auscultation       Cardiovascular negative cardio ROS Normal cardiovascular exam Rhythm:Regular Rate:Normal     Neuro/Psych negative neurological ROS  negative psych ROS   GI/Hepatic Neg liver ROS, GERD  Medicated and Controlled,Cholelithiasis- symptomatic ERCP done yesterday   Endo/Other  negative endocrine ROS  Renal/GU negative Renal ROS  negative genitourinary   Musculoskeletal negative musculoskeletal ROS (+)   Abdominal   Peds  Hematology negative hematology ROS (+)   Anesthesia Other Findings   Reproductive/Obstetrics negative OB ROS                            Anesthesia Physical Anesthesia Plan  ASA: II  Anesthesia Plan: General   Post-op Pain Management:    Induction: Intravenous  PONV Risk Score and Plan: 4 or greater and Scopolamine patch - Pre-op, Midazolam, Dexamethasone, Ondansetron and Treatment may vary due to age or medical condition  Airway Management Planned: Oral ETT  Additional Equipment:   Intra-op Plan:   Post-operative Plan: Extubation in OR  Informed Consent: I have reviewed the patients History and Physical, chart, labs and discussed the procedure including the risks, benefits and alternatives for the proposed anesthesia with the patient or authorized representative who has indicated his/her understanding and acceptance.   Dental advisory given  Plan Discussed with: CRNA and Surgeon  Anesthesia Plan Comments:         Anesthesia Quick Evaluation

## 2018-01-06 NOTE — Interval H&P Note (Signed)
History and Physical Interval Note:  01/06/2018 11:19 AM  Jennifer DikeJennifer Cruz-Granados  has presented today for surgery, with the diagnosis of SYMPTOMATIC CHOLELITHIASIS  The various methods of treatment have been discussed with the patient and family. After consideration of risks, benefits and other options for treatment, the patient has consented to  Procedure(s): LAPAROSCOPIC CHOLECYSTECTOMY WITH  POSSIBLE INTRAOPERATIVE CHOLANGIOGRAM (N/A) as a surgical intervention .  The patient's history has been reviewed, patient examined, no change in status, stable for surgery.  I have reviewed the patient's chart and labs.  Questions were answered to the patient's satisfaction.     [name]

## 2018-01-07 ENCOUNTER — Encounter (HOSPITAL_COMMUNITY): Payer: Self-pay | Admitting: General Surgery

## 2018-01-07 DIAGNOSIS — D649 Anemia, unspecified: Secondary | ICD-10-CM

## 2018-01-07 DIAGNOSIS — K812 Acute cholecystitis with chronic cholecystitis: Secondary | ICD-10-CM

## 2018-01-07 LAB — CBC WITH DIFFERENTIAL/PLATELET
Basophils Absolute: 0 10*3/uL (ref 0.0–0.1)
Basophils Relative: 0 %
EOS PCT: 0 %
Eosinophils Absolute: 0 10*3/uL (ref 0.0–0.7)
HEMATOCRIT: 35.6 % — AB (ref 36.0–46.0)
Hemoglobin: 11.7 g/dL — ABNORMAL LOW (ref 12.0–15.0)
LYMPHS PCT: 29 %
Lymphs Abs: 2.5 10*3/uL (ref 0.7–4.0)
MCH: 30 pg (ref 26.0–34.0)
MCHC: 32.9 g/dL (ref 30.0–36.0)
MCV: 91.3 fL (ref 78.0–100.0)
MONO ABS: 1 10*3/uL (ref 0.1–1.0)
MONOS PCT: 12 %
Neutro Abs: 5.1 10*3/uL (ref 1.7–7.7)
Neutrophils Relative %: 59 %
PLATELETS: 243 10*3/uL (ref 150–400)
RBC: 3.9 MIL/uL (ref 3.87–5.11)
RDW: 14.9 % (ref 11.5–15.5)
WBC: 8.6 10*3/uL (ref 4.0–10.5)

## 2018-01-07 LAB — NASOPHARYNGEAL CULTURE
Culture: NOT DETECTED
Special Requests: NORMAL

## 2018-01-07 LAB — COMPREHENSIVE METABOLIC PANEL
ALT: 234 U/L — ABNORMAL HIGH (ref 14–54)
AST: 61 U/L — ABNORMAL HIGH (ref 15–41)
Albumin: 3.5 g/dL (ref 3.5–5.0)
Alkaline Phosphatase: 97 U/L (ref 38–126)
Anion gap: 5 (ref 5–15)
BILIRUBIN TOTAL: 0.6 mg/dL (ref 0.3–1.2)
BUN: 6 mg/dL (ref 6–20)
CHLORIDE: 108 mmol/L (ref 101–111)
CO2: 29 mmol/L (ref 22–32)
Calcium: 8.8 mg/dL — ABNORMAL LOW (ref 8.9–10.3)
Creatinine, Ser: 0.54 mg/dL (ref 0.44–1.00)
Glucose, Bld: 124 mg/dL — ABNORMAL HIGH (ref 65–99)
POTASSIUM: 3.9 mmol/L (ref 3.5–5.1)
Sodium: 142 mmol/L (ref 135–145)
TOTAL PROTEIN: 6.2 g/dL — AB (ref 6.5–8.1)

## 2018-01-07 LAB — MAGNESIUM: Magnesium: 1.5 mg/dL — ABNORMAL LOW (ref 1.7–2.4)

## 2018-01-07 LAB — PHOSPHORUS: Phosphorus: 3.2 mg/dL (ref 2.5–4.6)

## 2018-01-07 MED ORDER — GABAPENTIN 300 MG PO CAPS: 300.0000 mg | ORAL_CAPSULE | Freq: Two times a day (BID) | ORAL | 0 refills | Status: DC

## 2018-01-07 MED ORDER — MAGNESIUM SULFATE 2 GM/50ML IV SOLN
2.0000 g | Freq: Once | INTRAVENOUS | Status: AC
  Administered 2018-01-07: 2 g via INTRAVENOUS
  Filled 2018-01-07: qty 50

## 2018-01-07 MED ORDER — SENNOSIDES-DOCUSATE SODIUM 8.6-50 MG PO TABS: 1.0000 | ORAL_TABLET | Freq: Two times a day (BID) | ORAL | 0 refills | Status: DC

## 2018-01-07 MED ORDER — OXYCODONE HCL 5 MG PO TABS: 5.0000 mg | ORAL_TABLET | ORAL | 0 refills | Status: DC | PRN

## 2018-01-07 NOTE — Progress Notes (Signed)
1 Day Post-Op   Subjective/Chief Complaint: No nausea.  Pain controlled with medications.     Objective: Vital signs in last 24 hours: Temp:  [97.7 F (36.5 C)-98.7 F (37.1 C)] 97.7 F (36.5 C) (06/23 0559) Pulse Rate:  [57-117] 61 (06/23 0559) Resp:  [12-18] 12 (06/23 0559) BP: (104-143)/(62-100) 109/66 (06/23 0559) SpO2:  [95 %-100 %] 98 % (06/23 0559) Last BM Date: 01/01/18  Intake/Output from previous day: 06/22 0701 - 06/23 0700 In: 7620 [P.O.:720; I.V.:6800; IV Piggyback:100] Out: 400 [Urine:400] Intake/Output this shift: No intake/output data recorded.  General appearance: alert, cooperative and no distress Resp: breathing comfortably GI: soft, non distended, approp tender at incisions. Extremities: extremities normal, atraumatic, no cyanosis or edema  Lab Results:  Recent Labs    01/06/18 0511 01/07/18 0519  WBC 6.7 8.6  HGB 13.0 11.7*  HCT 38.6 35.6*  PLT 278 243   BMET Recent Labs    01/06/18 0511 01/07/18 0519  NA 142 142  K 4.1 3.9  CL 108 108  CO2 26 29  GLUCOSE 108* 124*  BUN 7 6  CREATININE 0.51 0.54  CALCIUM 9.4 8.8*   PT/INR No results for input(s): LABPROT, INR in the last 72 hours. ABG No results for input(s): PHART, HCO3 in the last 72 hours.  Invalid input(s): PCO2, PO2  Studies/Results: Dg Ercp Biliary & Pancreatic Ducts  Result Date: 01/05/2018 CLINICAL DATA:  Gallstones EXAM: ERCP TECHNIQUE: Multiple spot images obtained with the fluoroscopic device and submitted for interpretation post-procedure. FLUOROSCOPY TIME:  Fluoroscopy Time:  2 minutes and 50 seconds Radiation Exposure Index (if provided by the fluoroscopic device): Number of Acquired Spot Images: 0 COMPARISON:  None. FINDINGS: Contrast fills the biliary tree. There are no filling defects in the common bile duct. IMPRESSION: See above. These images were submitted for radiologic interpretation only. Please see the procedural report for the amount of contrast and the  fluoroscopy time utilized. Electronically Signed   By: Jolaine ClickArthur  Hoss M.D.   On: 01/05/2018 14:10    Anti-infectives: Anti-infectives (From admission, onward)   Start     Dose/Rate Route Frequency Ordered Stop   01/06/18 0600  ceFAZolin (ANCEF) IVPB 2g/100 mL premix     2 g 200 mL/hr over 30 Minutes Intravenous On call to O.R. 01/05/18 1445 01/06/18 1229      Assessment/Plan: s/p Procedure(s): LAPAROSCOPIC CHOLECYSTECTOMY (N/A)   doing well.  OK for home. Discussed constipation with patient. Advised to take stool softeners at home. Will put discharge info in chart.     LOS: 3 days    Jennifer Hunter 01/07/2018

## 2018-01-07 NOTE — Discharge Summary (Signed)
Physician Discharge Summary  Loni Delbridge ZOX:[id number] DOB: [birth year] DOA: 01/04/2018  PCP: Patient, No Pcp Per  Admit date: 01/04/2018 Discharge date: 01/07/2018  Admitted From: Home Disposition: Home  Recommendations for Outpatient Follow-up:  1. Follow up with and establish with PCP in 1-2 weeks 2. Follow up with Dr. Donell Beers in General Surgery within 2 weeks  3. Please obtain CMP/CBC, Mag, Phos in one week 4. Please follow up on the following pending results:  Home Health:  No Equipment/Devices:  None  Discharge Condition: Stable  CODE STATUS: FULL CODE Diet recommendation: Soft Diet   Brief/Interim Summary: Jennifer Cruz-Granadosis a 17 y.o.femalewithno previous significant medical history who presents with 5-day history of right upper quadrant abdominal pain. She stated that eating makes it worse and describes it as a sharp pain that has been constant in nature. She admits to poor p.o. intake, nausea and vomiting as well. Denies any fevers at home, chest pain, shortness of breath, cough, diarrhea, dysuria. She notes that she has had this previously during pregnancy which resolved without intervention. In the ED,Labs were obtained which revealed elevated liver enzyme AST 220, ALT 226. Right upper quadrant ultrasound revealed choledocholithiasis with dilated common bile duct 9 mm. She was admitted for Suspected Choledocholithiasis and GI Consulted. Underwent MRCP and ERCP and found to have biliary stone and choledocholithiasis that was removed with biliary sphincterotomy and General Surgery consulted for Cholecystectomy and was done yesterday patient is doing well postoperatively.  She complains of some mild abdominal tenderness however this is controlled with p.o. pain medications.  She is deemed medically stable and cleared by general surgery for discharge she will be discharged home at this time she will need to follow-up  and establish with PCP and follow up with  General Surgery as an outpatient in 2 weeks.   Discharge Diagnoses:  Principal Problem:   Acute on chronic cholecystitis s/p lap cholecystectomy 01/06/2018 Active Problems:   Choledocholithiasis s/p ERCP 01/05/2018   Elevated LFTs  Abdominal Pain from Choledocholithiasis, improved -[place] WJX:[id number] is noted without evidence of cholecystitis. Common bile duct dilatation44mm  -MRCP done and showed Cholelithiasis without evidence of cholecystitis; Also showed Borderline extrahepatic biliary dilatation, slightly improved from earlier ultrasound. Questionable finding in the distal common bile duct on one series only without definite choledocholithiasis. Improving LFT elevation suggests possible recent passage of a small stone -GI consulted and recommended proceeding with ERCP  -ERCP showed a filling defect consistent with a stone seen on the cholangiogram and choledocholithiasis was found and compelte removal was done by biliary sphincterotomy and balloon extraction along with biliary sweeping -GI also recommending Surgical Consultation for Cholecystectomy and Dr. Donell Beers notified by Dr. Marca Ancona -General Surgery took the patient for Cholecystectomy yesterday AM and she was NPO at midnight; Surgical Prophylaxis Abx given with Cefazolin -Pain Control with Hydromorphone 1 mg q3hprn Moderate Pain and Oxycodone 5 mg po q4hprn while hospitalized and she will be discharged on po Oxycodone per General Surgery  -Currently now on Soft Diet -Antiemetics with Ondansetron 4 mg po/IV q6hprn Nausea while hospitalized  -Was started on IVF and given 2 Liter LR boluses and then started on Maintenance Fluid with NS at 150 mL/hr and will now D/C now that she is being discharged home  Abnormal LFT's/Elevated LFT's in the setting of Choledocholithiasis  -AST was trending down and went from 220 -> 85 but trended back up to 163 after ERCP; Now AST is 61 -ALT was trending down and went from 470 -> 307 but trended  up and  went to 393 after ERCP; Now ALT is 234 -MRCP done and as above -ERCP done and showed biliary stone and choledocholithiasis. -C/w IVF as above at NS at 150 mL/rh -Continue to Monitor and Trend LFT's and repeat CMP as an outpatient   Hyperglycemia -Likely Reactive; CMP's have showed Blood Glucose ranging from 83-116 -Checked HbA1c and was 5.0 -If consistently elevated will consider adding Sensitive Novolog SSI -Follow up with PCP   Hypomagnesemia -Patient's magnesium this morning was 1.5 -Replete with IV mag sulfate 2 g -Continue to monitor and replete as necessary -Repeat magnesium level in outpatient setting  Normocytic Anemia -Patient's Hemoglobin/Hematocrit went from 13.0/38.6 and dropped to 11.7/35.6 -Likely in the setting of dilutional drop from IV fluid resuscitation and postoperative drop -Continue to monitor for signs and symptoms of bleeding -Repeat CBC in outpatient setting with PCP  Discharge Instructions  Discharge Instructions    Call MD for:  difficulty breathing, headache or visual disturbances   Complete by:  As directed    Call MD for:  extreme fatigue   Complete by:  As directed    Call MD for:  hives   Complete by:  As directed    Call MD for:  persistant dizziness or light-headedness   Complete by:  As directed    Call MD for:  persistant nausea and vomiting   Complete by:  As directed    Call MD for:  redness, tenderness, or signs of infection (pain, swelling, redness, odor or green/yellow discharge around incision site)   Complete by:  As directed    Call MD for:  severe uncontrolled pain   Complete by:  As directed    Call MD for:  temperature >100.4   Complete by:  As directed    Diet general   Complete by:  As directed    SOFT Diet   Discharge instructions   Complete by:  As directed    Follow up and establish with a PCP within 1 week. Follow up with General Surgery as scheduled. Take all medications as prescribed. If symptoms change or worsen  please return to the ED for evaluation.   Increase activity slowly   Complete by:  As directed      Allergies as of 01/07/2018   No Known Allergies     Medication List    TAKE these medications   gabapentin 300 MG capsule Commonly known as:  NEURONTIN Take 1 capsule (300 mg total) by mouth 2 (two) times daily.   omeprazole 20 MG capsule Commonly known as:  PRILOSEC Take 1 capsule (20 mg total) by mouth daily.   ondansetron 4 MG disintegrating tablet Commonly known as:  ZOFRAN ODT Take 1 tablet (4 mg total) by mouth every 8 (eight) hours as needed for nausea or vomiting.   oxyCODONE 5 MG immediate release tablet Commonly known as:  Oxy IR/ROXICODONE Take 1 tablet (5 mg total) by mouth every 4 (four) hours as needed for moderate pain.   PRESCRIPTION MEDICATION 1 Units. Explanon implant   senna-docusate 8.6-50 MG tablet Commonly known as:  Senokot-S Take 1 tablet by mouth 2 (two) times daily.   sucralfate 1 g tablet Commonly known as:  CARAFATE Take 1 tablet (1 g total) by mouth 4 (four) times daily -  with meals and at bedtime for 14 days.      Follow-up Information    [facility] Follow up in 2 week(s).   Specialty:  General Surgery Why:  Our office will call with an appointment.   Contact information: [address] [place] [address] [phone]         No Known Allergies  Consultations:  [facility]  General Surgery  Procedures/Studies: Mr 3d Recon At Scanner  Result Date: 01/05/2018 CLINICAL DATA:  Acute right upper quadrant abdominal pain for 3 days. Elevated liver function studies. Evaluate for choledocholithiasis. EXAM: MRI ABDOMEN WITHOUT AND WITH CONTRAST (INCLUDING MRCP) TECHNIQUE: Multiplanar multisequence MR imaging of the abdomen was performed both before and after the administration of intravenous contrast. Heavily T2-weighted images of the biliary and pancreatic ducts were  obtained, and three-dimensional MRCP images were rendered by post processing. CONTRAST:  12mL MULTIHANCE GADOBENATE DIMEGLUMINE 529 MG/ML IV SOLN COMPARISON:  Abdominal ultrasound 01/04/2018. FINDINGS: Lower chest:  The visualized lower chest appears unremarkable. Hepatobiliary: The liver is normal in signal without steatosis, focal lesion or abnormal enhancement. There is a 12 mm gallstone. There is no gallbladder wall thickening or surrounding inflammation. There is no intrahepatic biliary dilatation. Extrahepatic biliary dilatation has slightly improved from earlier ultrasound. The common hepatic duct measures 8 mm maximally. No definite filling defects within the common bile duct on the thin section MRCP images. On coronal image 18 of series 7, there is a questionable tiny filling defect, although there is no corresponding abnormality on the other sequences. Pancreas: Unremarkable. No pancreatic ductal dilatation or surrounding inflammatory changes. No pancreas divisum. Spleen: Normal in size without focal abnormality. Adrenals/Urinary Tract: Both adrenal glands appear normal. Tiny cyst in the interpolar region of the right kidney. The kidneys otherwise appear normal. Stomach/Bowel: No evidence of bowel wall thickening, distention or surrounding inflammatory change. Vascular/Lymphatic: There are no enlarged abdominal lymph nodes. No significant vascular findings. Other: No ascites. Musculoskeletal: No acute or significant osseous findings. IMPRESSION: 1. Cholelithiasis without evidence of cholecystitis. 2. Borderline extrahepatic biliary dilatation, slightly improved from earlier ultrasound. Questionable finding in the distal common bile duct on one series only without definite choledocholithiasis. Improving LFT elevation suggests possible recent passage of a small stone. Electronically Signed   By: Carey Bullocks M.D.   On: 01/05/2018 10:31   Dg Ercp Biliary & Pancreatic Ducts  Result Date:  01/05/2018 CLINICAL DATA:  Gallstones EXAM: ERCP TECHNIQUE: Multiple spot images obtained with the fluoroscopic device and submitted for interpretation post-procedure. FLUOROSCOPY TIME:  Fluoroscopy Time:  2 minutes and 50 seconds Radiation Exposure Index (if provided by the fluoroscopic device): Number of Acquired Spot Images: 0 COMPARISON:  None. FINDINGS: Contrast fills the biliary tree. There are no filling defects in the common bile duct. IMPRESSION: See above. These images were submitted for radiologic interpretation only. Please see the procedural report for the amount of contrast and the fluoroscopy time utilized. Electronically Signed   By: Jolaine Click M.D.   On: 01/05/2018 14:10   Mr Abdomen Mrcp W Wo Contast  Result Date: 01/05/2018 CLINICAL DATA:  Acute right upper quadrant abdominal pain for 3 days. Elevated liver function studies. Evaluate for choledocholithiasis. EXAM: MRI ABDOMEN WITHOUT AND WITH CONTRAST (INCLUDING MRCP) TECHNIQUE: Multiplanar multisequence MR imaging of the abdomen was performed both before and after the administration of intravenous contrast. Heavily T2-weighted images of the biliary and pancreatic ducts were obtained, and three-dimensional MRCP images were rendered by post processing. CONTRAST:  12mL MULTIHANCE GADOBENATE DIMEGLUMINE 529 MG/ML IV SOLN COMPARISON:  Abdominal ultrasound 01/04/2018. FINDINGS: Lower chest:  The visualized lower chest appears unremarkable. Hepatobiliary: The liver is normal in signal without steatosis,  focal lesion or abnormal enhancement. There is a 12 mm gallstone. There is no gallbladder wall thickening or surrounding inflammation. There is no intrahepatic biliary dilatation. Extrahepatic biliary dilatation has slightly improved from earlier ultrasound. The common hepatic duct measures 8 mm maximally. No definite filling defects within the common bile duct on the thin section MRCP images. On coronal image 18 of series 7, there is a  questionable tiny filling defect, although there is no corresponding abnormality on the other sequences. Pancreas: Unremarkable. No pancreatic ductal dilatation or surrounding inflammatory changes. No pancreas divisum. Spleen: Normal in size without focal abnormality. Adrenals/Urinary Tract: Both adrenal glands appear normal. Tiny cyst in the interpolar region of the right kidney. The kidneys otherwise appear normal. Stomach/Bowel: No evidence of bowel wall thickening, distention or surrounding inflammatory change. Vascular/Lymphatic: There are no enlarged abdominal lymph nodes. No significant vascular findings. Other: No ascites. Musculoskeletal: No acute or significant osseous findings. IMPRESSION: 1. Cholelithiasis without evidence of cholecystitis. 2. Borderline extrahepatic biliary dilatation, slightly improved from earlier ultrasound. Questionable finding in the distal common bile duct on one series only without definite choledocholithiasis. Improving LFT elevation suggests possible recent passage of a small stone. Electronically Signed   By: Carey BullocksWilliam  Veazey M.D.   On: 01/05/2018 10:31   [place] Abdomen Limited Ruq  Result Date: 01/04/2018 CLINICAL DATA:  Acute right upper quadrant abdominal pain. EXAM: ULTRASOUND ABDOMEN LIMITED RIGHT UPPER QUADRANT COMPARISON:  None. FINDINGS: Gallbladder: Large gallstone measuring 1.9 cm is noted. No gallbladder wall thickening or pericholecystic fluid is noted. No sonographic Murphy's sign is noted. Sludge is noted within gallbladder lumen is well. Common bile duct: Diameter: Dilated at 9 mm. Probable large stone is noted in distal common bile duct. Liver: No focal lesion identified. Within normal limits in parenchymal echogenicity. Portal vein is patent on color Doppler imaging with normal direction of blood flow towards the liver. IMPRESSION: Cholelithiasis is noted without evidence of cholecystitis. Common bile duct dilatation is noted due to stone or stones in distal  common bile duct consistent with choledocholithiasis. Further evaluation with MRCP or CT scan is recommended. Electronically Signed   By: Lupita RaiderJames  Green Jr, M.D.   On: 01/04/2018 14:36   ERCP Findings: The scout film was normal. The esophagus was successfully intubated  under direct vision. The scope was advanced to a normal major papilla in  the descending duodenum without detailed examination of the pharynx,  larynx and associated structures, and upper GI tract. The upper GI tract  was grossly normal. The bile duct was deeply cannulated with the  sphincterotome. Contrast was injected. I personally interpreted the bile  duct images. There was brisk flow of contrast through the ducts. Image  quality was excellent. Contrast extended to the entire biliary tree. The  lower third of the main bile duct contained filling defect(s) thought to  be a stone. The biliary tree was otherwise normal. A long 0.035 inch  Stiff Jagwire was passed into the biliary tree. A 12 mm biliary  sphincterotomy was made with a braided sphincterotome using ERBE  electrocautery. There was no post-sphincterotomy bleeding. The biliary  tree was swept with a 12 mm balloon starting at the bifurcation. One  stone was removed. No stones remained. The pancreatic duct was never canulated or injected during the procedure. Impression: - A filling defect consistent with a stone was seen  on the cholangiogram. - Choledocholithiasis was found. Complete removal  was accomplished by biliary sphincterotomy and  balloon extraction. - A biliary sphincterotomy was  performed. - The biliary tree was swept.  Subjective: Seen and examined at bedside and states she slept okay.  Was complaining of  some abdominal tenderness and soreness after surgery but states that pain control with p.o. medications helped.  No lightheadedness or dizziness.  No other concerns or complaints at this time and was deemed medically stable to be discharged by general surgery.  Wanting to go home.  Discharge Exam: Vitals:   01/07/18 0221 01/07/18 0559  BP: 104/62 109/66  Pulse: (!) 57 61  Resp: 13 12  Temp: 98.3 F (36.8 C) 97.7 F (36.5 C)  SpO2: 99% 98%   Vitals:   01/06/18 1750 01/06/18 2222 01/07/18 0221 01/07/18 0559  BP: (!) 136/92 130/78 104/62 109/66  Pulse: 69 64 (!) 57 61  Resp: 18 17 13 12   Temp:  98.7 F (37.1 C) 98.3 F (36.8 C) 97.7 F (36.5 C)  TempSrc:  Axillary Oral Oral  SpO2: 99% 99% 99% 98%  Weight:      Height:       General: Pt is alert, awake, not in acute distress Cardiovascular: RRR, S1/S2 +, no rubs, no gallops Respiratory: CTA bilaterally, no wheezing, no rhonchi Abdominal: Soft, Slightly tender, ND, bowel sounds + Extremities: no edema, no cyanosis  The results of significant diagnostics from this hospitalization (including imaging, microbiology, ancillary and laboratory) are listed below for reference.    Microbiology: No results found for this or any previous visit (from the past 240 hour(s)).   Labs: BNP (last 3 results) No results for input(s): BNP in the last 8760 hours. Basic Metabolic Panel: Recent Labs  Lab 01/02/18 1538 01/04/18 1210 01/05/18 0430 01/06/18 0511 01/07/18 0519  NA 143 140 141 142 142  K 3.7 3.8 4.2 4.1 3.9  CL 104 104 108 108 108  CO2 30 31 28 26 29   GLUCOSE 116* 106* 83 108* 124*  BUN 13 9 9 7 6   CREATININE 0.65 0.58 0.61 0.51 0.54  CALCIUM 9.8 9.3 9.1 9.4 8.8*  MG  --   --   --  1.7 1.5*  PHOS  --   --   --  3.2 3.2   Liver Function Tests: Recent Labs  Lab 01/02/18 1538 01/04/18 1210 01/05/18 0430 01/06/18 0511 01/07/18 0519  AST 220* 185* 85* 163* 61*  ALT 226* 470* 307* 393* 234*  ALKPHOS 107 138* 114 132* 97   BILITOT 1.1 0.8 0.9 0.9 0.6  PROT 7.8 7.7 6.8 7.1 6.2*  ALBUMIN 4.2 4.4 3.8 3.9 3.5   Recent Labs  Lab 01/02/18 1538 01/04/18 1210 01/06/18 0511  LIPASE 29 29 27    No results for input(s): AMMONIA in the last 168 hours. CBC: Recent Labs  Lab 01/02/18 1538 01/04/18 1210 01/05/18 0430 01/06/18 0511 01/07/18 0519  WBC 10.4 5.3 4.4 6.7 8.6  NEUTROABS  --   --   --  4.1 5.1  HGB 13.9 13.4 12.8 13.0 11.7*  HCT 42.8 40.3 38.6 38.6 35.6*  MCV 91.1 92.0 90.6 89.1 91.3  PLT 313 289 254 278 243   Cardiac Enzymes: No results for input(s): CKTOTAL, CKMB, CKMBINDEX, TROPONINI in the last 168 hours. BNP: Invalid input(s): POCBNP CBG: No results for input(s): GLUCAP in the last 168 hours. D-Dimer No results for input(s): DDIMER in the last 72 hours. Hgb A1c Recent Labs    01/06/18 0511  HGBA1C 5.0   Lipid Profile No results for input(s): CHOL, HDL, LDLCALC, TRIG, CHOLHDL, LDLDIRECT in the last 72 hours.  Thyroid function studies No results for input(s): TSH, T4TOTAL, T3FREE, THYROIDAB in the last 72 hours.  Invalid input(s): FREET3 Anemia work up No results for input(s): VITAMINB12, FOLATE, FERRITIN, TIBC, IRON, RETICCTPCT in the last 72 hours. Urinalysis    Component Value Date/Time   COLORURINE STRAW (A) 01/04/2018 1314   APPEARANCEUR CLEAR 01/04/2018 1314   LABSPEC 1.006 01/04/2018 1314   PHURINE 9.0 (H) 01/04/2018 1314   GLUCOSEU NEGATIVE 01/04/2018 1314   HGBUR SMALL (A) 01/04/2018 1314   BILIRUBINUR NEGATIVE 01/04/2018 1314   KETONESUR NEGATIVE 01/04/2018 1314   PROTEINUR NEGATIVE 01/04/2018 1314   UROBILINOGEN 0.2 01/02/2018 1424   NITRITE NEGATIVE 01/04/2018 1314   LEUKOCYTESUR NEGATIVE 01/04/2018 1314   Sepsis Labs Invalid input(s): PROCALCITONIN,  WBC,  LACTICIDVEN Microbiology No results found for this or any previous visit (from the past 240 hour(s)).  Time coordinating discharge: 35 minutes  SIGNED:  Merlene Laughter, DO [facility] 01/07/2018, 11:01 AM Pager [phone]  If 7PM-7AM, please contact night-coverage [email or web address] Password [email or web address]

## 2018-01-07 NOTE — Discharge Instructions (Signed)
[facility] ______[facility] °LAPAROSCOPIC SURGERY: POST OP INSTRUCTIONS °Always review your discharge instruction sheet given to you by the facility where your surgery was performed. °IF YOU HAVE DISABILITY OR FAMILY LEAVE FORMS, YOU MUST BRING THEM TO THE OFFICE FOR PROCESSING.   °DO NOT GIVE THEM TO YOUR DOCTOR. ° °1. A prescription for pain medication may be given to you upon discharge.  Take your pain medication as prescribed, if needed.  If narcotic pain medicine is not needed, then you may take acetaminophen (Tylenol) or ibuprofen (Advil) as needed. °2. Take your usually prescribed medications unless otherwise directed. °3. If you need a refill on your pain medication, please contact your pharmacy.  They will contact our office to request authorization. Prescriptions will not be filled after 5pm or on week-ends. °4. You should follow a light diet the first few days after arrival home, such as soup and crackers, etc.  Be sure to include lots of fluids daily. °5. Most patients will experience some swelling and bruising in the area of the incisions.  Ice packs will help.  Swelling and bruising can take several days to resolve.  °6. It is common to experience some constipation if taking pain medication after surgery.  Increasing fluid intake and taking a stool softener (such as Colace) will usually help or prevent this problem from occurring.  A mild laxative (Milk of Magnesia or Miralax) should be taken according to package instructions if there are no bowel movements after 48 hours. °7. Unless discharge instructions indicate otherwise, you may remove your bandages 24-48 hours after surgery, and you may shower at that time.  You may have steri-strips (small skin tapes) in place directly over the incision.  These strips should be left on the skin for 7-10 days.  If your surgeon used skin glue on the incision, you may shower in 24 hours.  The glue will flake off over the next 2-3 weeks.  Any sutures or  staples will be removed at the office during your follow-up visit. °8. ACTIVITIES:  You may resume regular (light) daily activities beginning the next day--such as daily self-care, walking, climbing stairs--gradually increasing activities as tolerated.  You may have sexual intercourse when it is comfortable.  Refrain from any heavy lifting or straining until approved by your doctor. °a. You may drive when you are no longer taking prescription pain medication, you can comfortably wear a seatbelt, and you can safely maneuver your car and apply brakes. °b. RETURN TO WORK:  __________________________________________________________ °9. You should see your doctor in the office for a follow-up appointment approximately 2-3 weeks after your surgery.  Make sure that you call for this appointment within a day or two after you arrive home to insure a convenient appointment time. °10. OTHER INSTRUCTIONS: __________________________________________________________________________________________________________________________ __________________________________________________________________________________________________________________________ °WHEN TO CALL YOUR DOCTOR: °1. Fever over 101.0 °2. Inability to urinate °3. Continued bleeding from incision. °4. Increased pain, redness, or drainage from the incision. °5. Increasing abdominal pain ° °The clinic staff is available to answer your questions during regular business hours.  Please don’t hesitate to call and ask to speak to one of the nurses for clinical concerns.  If you have a medical emergency, go to the nearest emergency room or call 911.  A surgeon from [facility] is always on call at the hospital. °[address], [address], [place], [place]  [address] ? [address] [address], [place],    [address] °[phone] ? [phone] ? FAX [phone] °Web site:   [email or web address] °

## 2018-01-07 NOTE — Progress Notes (Signed)
Assessment unchanged. Recent po analgesic given and pt ambulated in hall. Tolerated activity well. Significant other at bedside to drive pt home. Pt verbalized understanding of dc instructions reviewed through teach back including follow up care, when to call the surgeon, and medications to resume. Work note provided by hospitalist per pt request. Encouraged to call surgeon's office, if more time needed, per hospitalist. No scripts at dc. Discharged via wc to front entrance to meet significant other. Accompanied by RN.

## 2018-01-07 NOTE — Care Management Note (Signed)
Case Management Note  Patient Details  Name: Jennifer PortsJennifer Cruz-Granados MRN: [id number] Date of Birth: 01/05/2001  Subjective/Objective:    Laparoscopic Cholecystectomy                Action/Plan: NCM spoke to pt and explained she can contact Case worker at [facility] for list of providers for her Medicaid. [facility]. She can call on Monday to arrange follow up appt. Information on [facility] added to dc instructions.   Expected Discharge Date:  01/07/18               Expected Discharge Plan:  Home/Self Care  In-House Referral:  NA  Discharge planning Services  CM Consult, Other - See comment  Post Acute Care Choice:  NA Choice offered to:     DME Arranged:  N/A DME Agency:  NA  HH Arranged:  NA HH Agency:  NA  Status of Service:  Completed, signed off  If discussed at Long Length of Stay Meetings, dates discussed:    Additional Comments:  Elliot CousinShavis, [place] Kinyon Ellen, RN 01/07/2018, 11:10 AM

## 2018-01-08 ENCOUNTER — Encounter (HOSPITAL_COMMUNITY): Payer: Self-pay | Admitting: Gastroenterology

## 2018-06-16 ENCOUNTER — Encounter (HOSPITAL_COMMUNITY): Payer: Self-pay | Admitting: Emergency Medicine

## 2018-06-16 ENCOUNTER — Ambulatory Visit (HOSPITAL_COMMUNITY)
Admission: EM | Admit: 2018-06-16 | Discharge: 2018-06-16 | Disposition: A | Discharge status: Home / Self Care | Payer: Medicaid Other

## 2018-06-16 ENCOUNTER — Other Ambulatory Visit: Payer: Self-pay

## 2018-06-16 DIAGNOSIS — Z3202 Encounter for pregnancy test, result negative: Secondary | ICD-10-CM

## 2018-06-16 DIAGNOSIS — R300 Dysuria: Secondary | ICD-10-CM | POA: Diagnosis not present

## 2018-06-16 LAB — POCT URINALYSIS DIP (DEVICE)
Bilirubin Urine: NEGATIVE
GLUCOSE, UA: NEGATIVE mg/dL
Ketones, ur: NEGATIVE mg/dL
Nitrite: NEGATIVE
PROTEIN: NEGATIVE mg/dL
Specific Gravity, Urine: 1.02 (ref 1.005–1.030)
UROBILINOGEN UA: 0.2 mg/dL (ref 0.0–1.0)
pH: 6.5 (ref 5.0–8.0)

## 2018-06-16 LAB — POCT PREGNANCY, URINE: Preg Test, Ur: NEGATIVE

## 2018-06-16 MED ORDER — NITROFURANTOIN MONOHYD MACRO 100 MG PO CAPS: 100.0000 mg | ORAL_CAPSULE | Freq: Two times a day (BID) | ORAL | 0 refills | Status: DC

## 2018-06-16 NOTE — Discharge Instructions (Signed)
We will go ahead and treat you for urinary tract infection. Sending urine for culture Make sure you are staying hydrated

## 2018-06-16 NOTE — ED Triage Notes (Signed)
[facility] a [facility] pain and dysuria during her menstrual cycle.

## 2018-06-17 NOTE — ED Provider Notes (Signed)
[facility]    CSN: [id number] Arrival date & time: 06/16/18  1645     History   Chief Complaint Chief Complaint  Patient presents with  . Pelvic Pain    HPI Jennifer Hunter is a 17 y.o. female.   Patient is a 17 year old female presents with lower pelvic cramping, dysuria.  This is been present for 2 days.  She is currently on her menstrual cycle.  She has some mild lower back pain.  She denies any frequency.  She denies any vaginal discharge, itching or irritation.  No fevers.  ROS per HPI    Pelvic Pain     History reviewed. No pertinent past medical history.  Patient Active Problem List   Diagnosis Date Noted  . Acute on chronic cholecystitis s/p lap cholecystectomy 01/06/2018 01/07/2018  . Choledocholithiasis s/p ERCP 01/05/2018 01/04/2018  . Elevated LFTs 01/04/2018  . Vaginal delivery 03/06/2016    Past Surgical History:  Procedure Laterality Date  . CHOLECYSTECTOMY N/A 01/06/2018   Procedure: LAPAROSCOPIC CHOLECYSTECTOMY;  Surgeon: Almond LintByerly, Faera, MD;  Location: [facility];  Service: General;  Laterality: N/A;  . ERCP N/A 01/05/2018   Procedure: ENDOSCOPIC RETROGRADE CHOLANGIOPANCREATOGRAPHY (ERCP);  Surgeon: Kerin SalenKarki, Arya, MD;  [facility];  Service: Gastroenterology;  Laterality: N/A;  . REMOVAL OF STONES  01/05/2018   Procedure: REMOVAL OF STONES;  Surgeon: Kerin SalenKarki, Arya, MD;  Location: [facility];  Service: Gastroenterology;;  . Dennison MascotSPHINCTEROTOMY  01/05/2018   Procedure: Dennison MascotSPHINCTEROTOMY;  Surgeon: Kerin SalenKarki, Arya, MD;  Location: [facility];  Service: Gastroenterology;;    OB History    Gravida  2   Para  1   Term  1   Preterm  0   AB  0   Living  1     SAB  0   TAB  0   Ectopic  0   Multiple      Live Births  1            Home Medications    Prior to Admission medications   Medication Sig Start Date End Date Taking? Authorizing Provider  ibuprofen (ADVIL,MOTRIN) 400 MG tablet Take 400 mg by mouth every 6 (six)  hours as needed.   Yes [provider]  PRESCRIPTION MEDICATION 1 Units. Explanon implant   Yes [provider]  nitrofurantoin, macrocrystal-monohydrate, (MACROBID) 100 MG capsule Take 1 capsule (100 mg total) by mouth 2 (two) times daily. 06/16/18   Janace ArisBast, Masahiro Iglesia A, NP    Family History Family History  Problem Relation Age of Onset  . Hypertension Mother     Social History Social History   Tobacco Use  . Smoking status: Former Games developermoker  . Smokeless tobacco: Former Engineer, waterUser  Substance Use Topics  . Alcohol use: No  . Drug use: No     Allergies   Patient has no known allergies.   Review of Systems Review of Systems  Genitourinary: Positive for pelvic pain.     Physical Exam Triage Vital Signs ED Triage Vitals  Enc Vitals Group     BP 06/16/18 1723 119/77     Pulse Rate 06/16/18 1723 88     Resp 06/16/18 1723 16     Temp 06/16/18 1723 98.7 F (37.1 C)     Temp Source 06/16/18 1723 Oral     SpO2 06/16/18 1723 99 %     Weight --      Height --      Head Circumference --  Peak Flow --      Pain Score 06/16/18 1720 7     Pain Loc --      Pain Edu? --      Excl. in [place]? --    No data found.  Updated Vital Signs BP 119/77 (BP Location: Left Arm)   Pulse 88   Temp 98.7 F (37.1 C) (Oral)   Resp 16   LMP 06/16/2018   SpO2 99%   Visual Acuity Right Eye Distance:   Left Eye Distance:   Bilateral Distance:    Right Eye Near:   Left Eye Near:    Bilateral Near:     Physical Exam  Constitutional: She appears well-developed and well-nourished.  HENT:  Head: Normocephalic.  Eyes: Conjunctivae are normal.  Neck: Normal range of motion.  Pulmonary/Chest: Effort normal.  Abdominal: Soft. Bowel sounds are normal.  Musculoskeletal: Normal range of motion.  Neurological: She is alert.  Skin: Skin is warm and dry.  Psychiatric: She has a normal mood and affect.  Nursing note and vitals reviewed.    UC Treatments / Results  Labs (all  labs ordered are listed, but only abnormal results are displayed) Labs Reviewed  POCT URINALYSIS DIP (DEVICE) - Abnormal; Notable for the following components:      Result Value   Hgb urine dipstick LARGE (*)    Leukocytes, UA SMALL (*)    All other components within normal limits  POCT PREGNANCY, URINE    EKG None  Radiology No results found.  Procedures Procedures (including critical care time)  Medications Ordered in UC Medications - No data to display  Initial Impression / Assessment and Plan / UC Course  I have reviewed the triage vital signs and the nursing notes.  Pertinent labs & imaging results that were available during my care of the patient were reviewed by me and considered in my medical decision making (see chart for details).     Urine showed trace leuks and hemoglobin We will go ahead and treat for urinary tract infection pending culture Follow up as needed for continued or worsening symptoms  Final Clinical Impressions(s) / UC Diagnoses   Final diagnoses:  Dysuria     Discharge Instructions     We will go ahead and treat you for urinary tract infection. Sending urine for culture Make sure you are staying hydrated    ED Prescriptions    Medication Sig Dispense Auth. Provider   nitrofurantoin, macrocrystal-monohydrate, (MACROBID) 100 MG capsule Take 1 capsule (100 mg total) by mouth 2 (two) times daily. 10 capsule Dahlia Byes A, NP     Controlled Substance Prescriptions [place] Controlled Substance Registry consulted? Not Applicable   Janace Aris, NP 06/17/18 1012

## 2018-07-04 IMAGING — US US ABDOMEN LIMITED
1 series · 14 of 25 positions shown · non-contrast
Comparison: None.

CLINICAL DATA: Acute right upper quadrant abdominal pain.

EXAM:
ULTRASOUND ABDOMEN LIMITED RIGHT UPPER QUADRANT

[Series 1: us abdomen limited · 0.22mm/px · 14 of 105 slices shown]
[im 1/105]
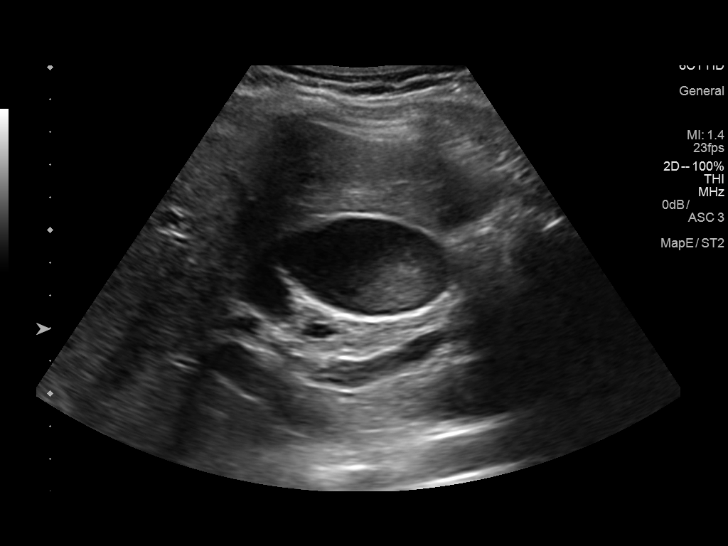
[im 9/105]
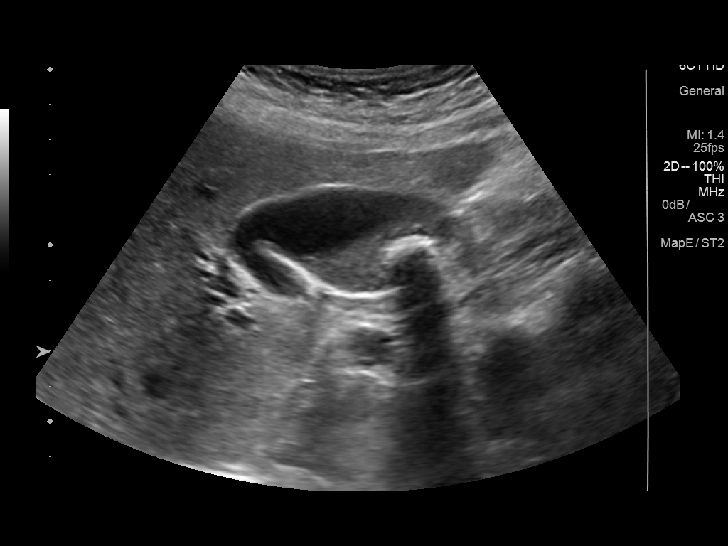
[im 18/105]
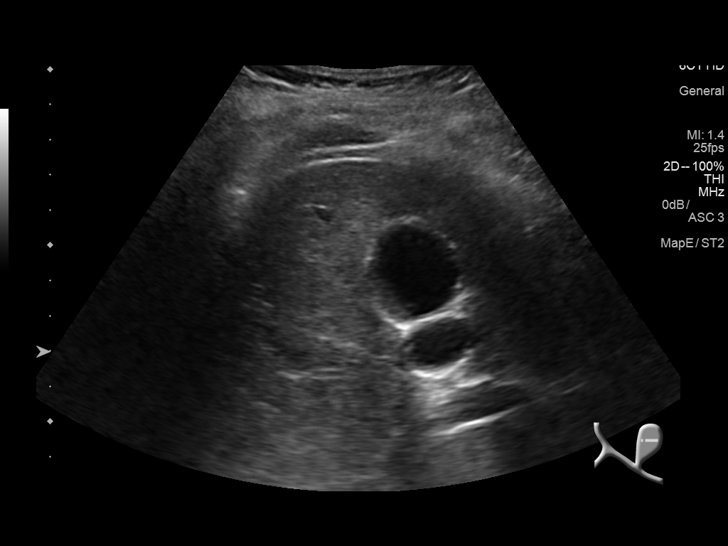
[im 27/105]
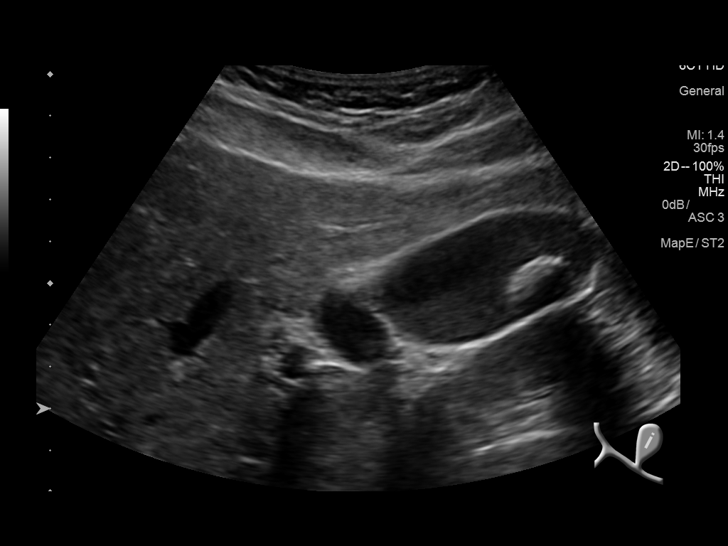
[im 35/105]
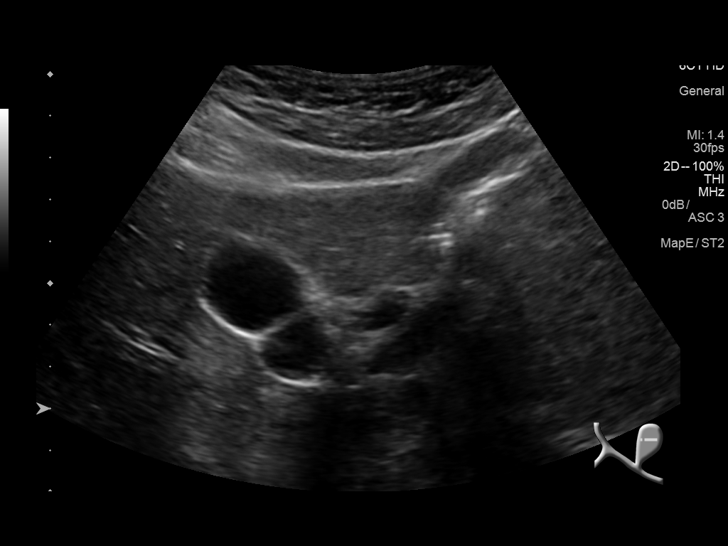
[im 40/105]
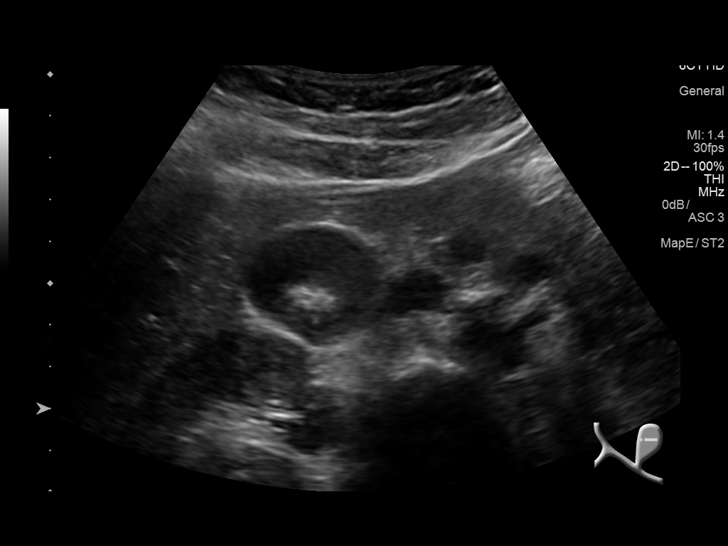
[im 48/105]
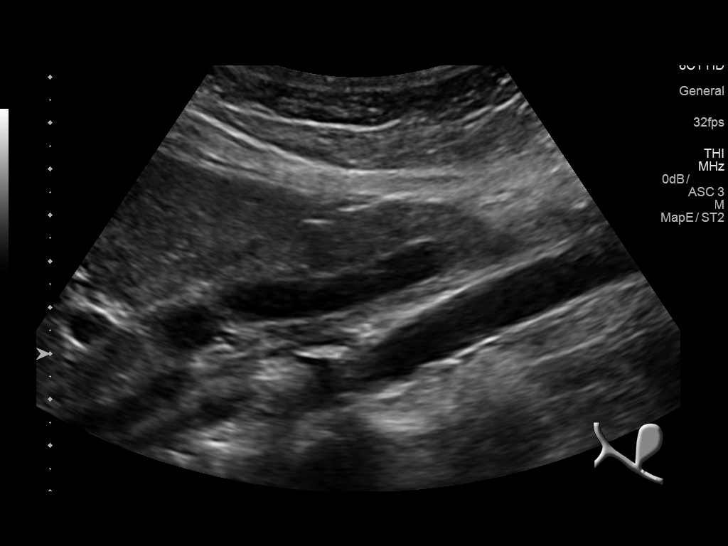
[im 57/105]
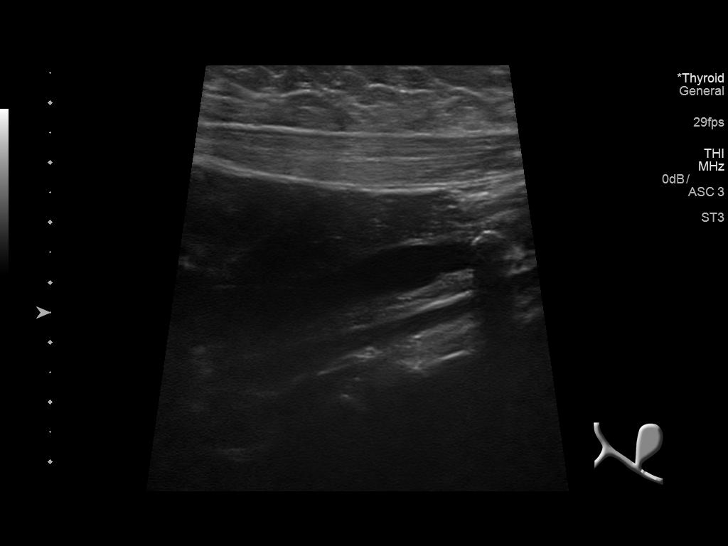
[im 66/105]
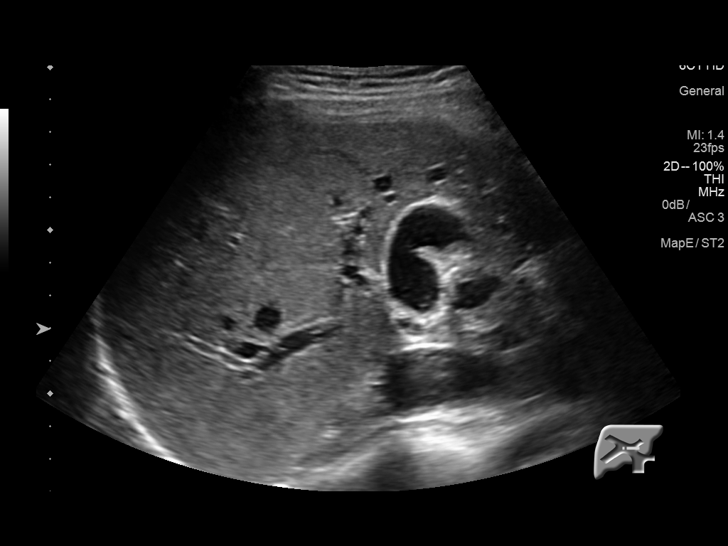
[im 70/105]
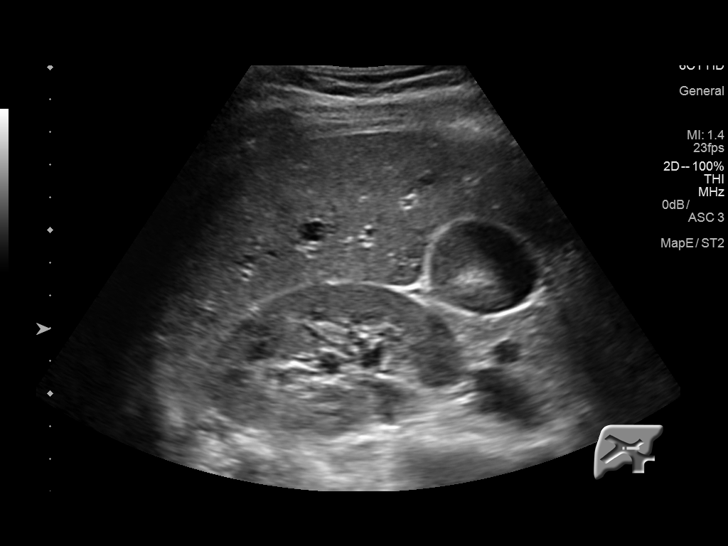
[im 79/105]
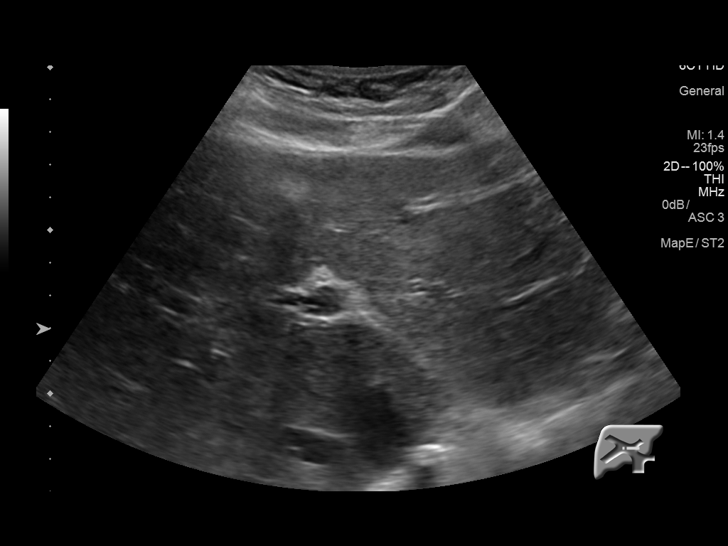
[im 87/105]
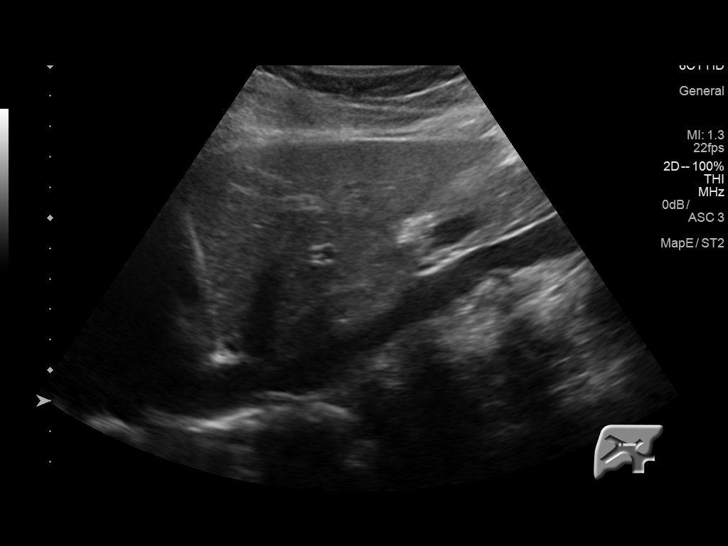
[im 96/105]
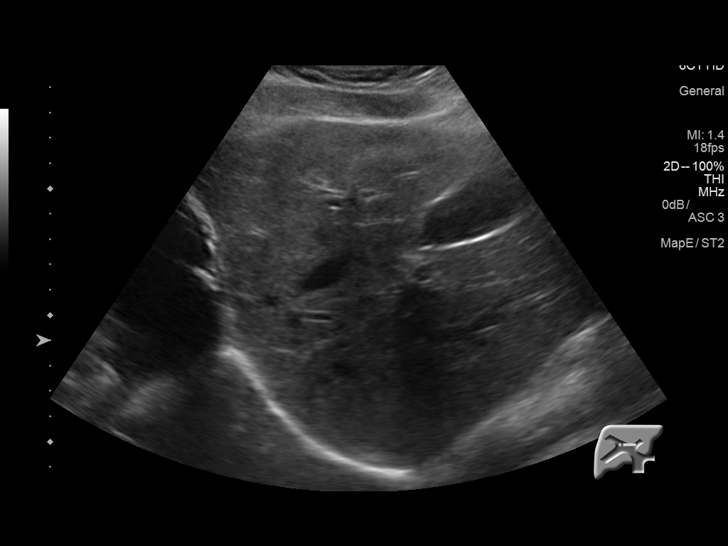
[im 105/105]
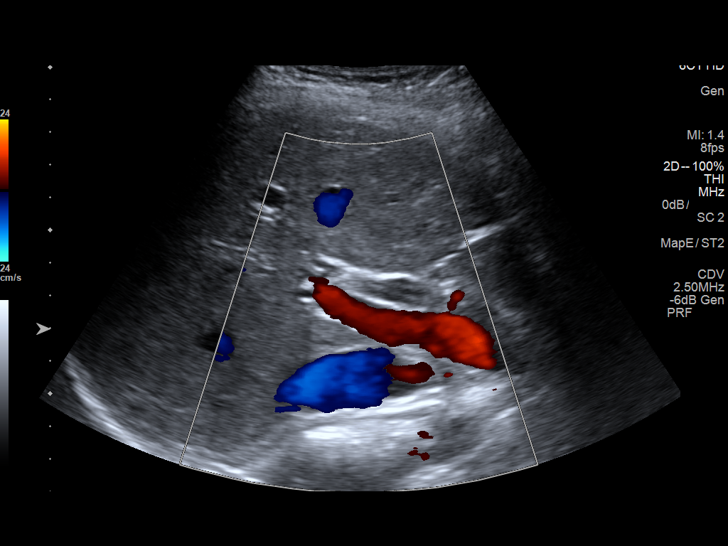

[14 of 25 positions shown; findings below may reference images not displayed]

FINDINGS: Gallbladder:

Large gallstone measuring 1.9 cm is noted. No gallbladder wall
thickening or pericholecystic fluid is noted. No sonographic
Murphy's sign is noted. Sludge is noted within gallbladder lumen is
well.

Common bile duct:

Diameter: Dilated at 9 mm. Probable large stone is noted in distal
common bile duct.

Liver:

No focal lesion identified. Within normal limits in parenchymal
echogenicity. Portal vein is patent on color Doppler imaging with
normal direction of blood flow towards the liver.
IMPRESSION: Cholelithiasis is noted without evidence of cholecystitis. Common
bile duct dilatation is noted due to stone or stones in distal
common bile duct consistent with choledocholithiasis. Further
evaluation with MRCP or CT scan is recommended.

## 2019-03-05 ENCOUNTER — Telehealth: Payer: Self-pay | Admitting: Medical

## 2019-03-05 NOTE — Telephone Encounter (Signed)
Called the patient to confirm the upcoming appointment. Left a detailed message of calling our office to confirm as the voicemail was generic.

## 2019-03-06 ENCOUNTER — Ambulatory Visit (INDEPENDENT_AMBULATORY_CARE_PROVIDER_SITE_OTHER): Payer: Medicaid Other | Admitting: Medical

## 2019-03-06 ENCOUNTER — Encounter: Payer: Self-pay | Admitting: Medical

## 2019-03-06 ENCOUNTER — Other Ambulatory Visit: Payer: Self-pay

## 2019-03-06 VITALS — BP 118/80 | HR 75 | Ht 62.0 in | Wt 142.4 lb

## 2019-03-06 DIAGNOSIS — Z30017 Encounter for initial prescription of implantable subdermal contraceptive: Secondary | ICD-10-CM

## 2019-03-06 DIAGNOSIS — Z3046 Encounter for surveillance of implantable subdermal contraceptive: Secondary | ICD-10-CM | POA: Diagnosis not present

## 2019-03-06 MED ORDER — ETONOGESTREL 68 MG ~~LOC~~ IMPL
68.0000 mg | DRUG_IMPLANT | Freq: Once | SUBCUTANEOUS | Status: AC
  Administered 2019-03-06: 09:00:00 68 mg via SUBCUTANEOUS

## 2019-03-06 NOTE — Progress Notes (Signed)
GYNECOLOGY CLINIC PROCEDURE NOTE  Ms. Jennifer Hunter is a 18 y.o. G2P1001 here for Nexplanon removal and insertion. No GYN concerns. No other gynecologic concerns.  Nexplanon Removal and Insertion  Patient was given informed consent for removal of her Implanon and insertion of Nexplanon.  Patient does understand that irregular bleeding is a very common side effect of this medication. She was advised to have backup contraception for one week after replacement of the implant. Pregnancy test in clinic today was negative.  Appropriate time out taken. Implanon site identified. Area prepped in usual sterile fashon. One ml of 1% lidocaine was used to anesthetize the area at the distal end of the implant. A small stab incision was made right beside the implant on the distal portion. The Nexplanon rod was grasped using hemostats and removed without difficulty. There was minimal blood loss. There were no complications. Area was then injected with 3 ml of 1 % lidocaine. Nexplanon removed from packaging, Device confirmed in needle, then inserted full length of needle and withdrawn per handbook instructions. Nexplanon was able to palpated in the patient's arm. There was minimal blood loss. Patient insertion site covered with guaze and a pressure bandage to reduce any bruising. The patient tolerated the procedure well and was given post procedure instructions.   Luvenia Redden, PA-C 03/06/2019 8:39 AM

## 2019-03-06 NOTE — Patient Instructions (Signed)
Etonogestrel implant What is this medicine? ETONOGESTREL (et oh noe JES trel) is a contraceptive (birth control) device. It is used to prevent pregnancy. It can be used for up to 3 years. This medicine may be used for other purposes; ask your health care provider or pharmacist if you have questions. COMMON BRAND NAME(S): Implanon, Nexplanon What should I tell my health care provider before I take this medicine? They need to know if you have any of these conditions:  abnormal vaginal bleeding  blood vessel disease or blood clots  breast, cervical, endometrial, ovarian, liver, or uterine cancer  diabetes  gallbladder disease  heart disease or recent heart attack  high blood pressure  high cholesterol or triglycerides  kidney disease  liver disease  migraine headaches  seizures  stroke  tobacco smoker  an unusual or allergic reaction to etonogestrel, anesthetics or antiseptics, other medicines, foods, dyes, or preservatives  pregnant or trying to get pregnant  breast-feeding How should I use this medicine? This device is inserted just under the skin on the inner side of your upper arm by a health care professional. Talk to your pediatrician regarding the use of this medicine in children. Special care may be needed. Overdosage: If you think you have taken too much of this medicine contact a poison control center or emergency room at once. NOTE: This medicine is only for you. Do not share this medicine with others. What if I miss a dose? This does not apply. What may interact with this medicine? Do not take this medicine with any of the following medications:  amprenavir  fosamprenavir This medicine may also interact with the following medications:  acitretin  aprepitant  armodafinil  bexarotene  bosentan  carbamazepine  certain medicines for fungal infections like fluconazole, ketoconazole, itraconazole and voriconazole  certain medicines to treat  hepatitis, HIV or AIDS  cyclosporine  felbamate  griseofulvin  lamotrigine  modafinil  oxcarbazepine  phenobarbital  phenytoin  primidone  rifabutin  rifampin  rifapentine  St. John's wort  topiramate This list may not describe all possible interactions. Give your health care provider a list of all the medicines, herbs, non-prescription drugs, or dietary supplements you use. Also tell them if you smoke, drink alcohol, or use illegal drugs. Some items may interact with your medicine. What should I watch for while using this medicine? This product does not protect you against HIV infection (AIDS) or other sexually transmitted diseases. You should be able to feel the implant by pressing your fingertips over the skin where it was inserted. Contact your doctor if you cannot feel the implant, and use a non-hormonal birth control method (such as condoms) until your doctor confirms that the implant is in place. Contact your doctor if you think that the implant may have broken or become bent while in your arm. You will receive a user card from your health care provider after the implant is inserted. The card is a record of the location of the implant in your upper arm and when it should be removed. Keep this card with your health records. What side effects may I notice from receiving this medicine? Side effects that you should report to your doctor or health care professional as soon as possible:  allergic reactions like skin rash, itching or hives, swelling of the face, lips, or tongue  breast lumps, breast tissue changes, or discharge  breathing problems  changes in emotions or moods  if you feel that the implant may have broken or   bent while in your arm  high blood pressure  pain, irritation, swelling, or bruising at the insertion site  scar at site of insertion  signs of infection at the insertion site such as fever, and skin redness, pain or discharge  signs and  symptoms of a blood clot such as breathing problems; changes in vision; chest pain; severe, sudden headache; pain, swelling, warmth in the leg; trouble speaking; sudden numbness or weakness of the face, arm or leg  signs and symptoms of liver injury like dark yellow or brown urine; general ill feeling or flu-like symptoms; light-colored stools; loss of appetite; nausea; right upper belly pain; unusually weak or tired; yellowing of the eyes or skin  unusual vaginal bleeding, discharge Side effects that usually do not require medical attention (report to your doctor or health care professional if they continue or are bothersome):  acne  breast pain or tenderness  headache  irregular menstrual bleeding  nausea This list may not describe all possible side effects. Call your doctor for medical advice about side effects. You may report side effects to FDA at 1-800-FDA-1088. Where should I keep my medicine? This drug is given in a hospital or clinic and will not be stored at home. NOTE: This sheet is a summary. It may not cover all possible information. If you have questions about this medicine, talk to your doctor, pharmacist, or health care provider.  2020 Elsevier/Gold Standard (2017-05-23 14:11:42) Nexplanon Instructions After Insertion   Keep bandage clean and dry for 24 hours   May use ice/Tylenol/Ibuprofen for soreness or pain   If you develop fever, drainage or increased warmth from incision site-contact office immediately   

## 2019-03-07 ENCOUNTER — Encounter: Payer: Self-pay | Admitting: General Practice

## 2019-04-23 ENCOUNTER — Other Ambulatory Visit: Payer: Self-pay

## 2019-04-23 DIAGNOSIS — Z20822 Contact with and (suspected) exposure to covid-19: Secondary | ICD-10-CM

## 2019-04-25 ENCOUNTER — Telehealth: Payer: Self-pay | Admitting: General Practice

## 2019-04-25 LAB — NOVEL CORONAVIRUS, NAA: SARS-CoV-2, NAA: NOT DETECTED

## 2019-04-25 NOTE — Telephone Encounter (Signed)
Patient informed of negative covid-19 result. Patient verbalized understanding.  °

## 2019-11-25 ENCOUNTER — Encounter (HOSPITAL_COMMUNITY): Payer: Self-pay

## 2019-11-25 ENCOUNTER — Other Ambulatory Visit: Payer: Self-pay

## 2019-11-25 ENCOUNTER — Ambulatory Visit (HOSPITAL_COMMUNITY)
Admission: EM | Admit: 2019-11-25 | Discharge: 2019-11-25 | Disposition: A | Discharge status: Home / Self Care | Payer: Medicaid Other | Attending: Family Medicine | Admitting: Family Medicine

## 2019-11-25 DIAGNOSIS — Z202 Contact with and (suspected) exposure to infections with a predominantly sexual mode of transmission: Secondary | ICD-10-CM | POA: Diagnosis not present

## 2019-11-25 NOTE — ED Triage Notes (Signed)
Pt request STI testing 2/2 partner disclosing he had been sexually active with someone else. Pt reports last sexual contact with former partner was approx 3 weeks ago.  Pt denies vag discharge, unusual bleeding, abdom pain, or n/v/d, fever.

## 2019-11-25 NOTE — ED Provider Notes (Signed)
[facility]    CSN: [id number] Arrival date & time: 11/25/19  1534      History   Chief Complaint Chief Complaint  Patient presents with  . Exposure to STD    HPI Jennifer Hunter is a 19 y.o. female.   HPI  Patient states that her boyfriend has some penile discomfort/dysuria and is "blaming her" for an infection.  Patient states she has no symptoms whatsoever.  She is here for STD testing because of the boyfriend's claims.   History reviewed. No pertinent past medical history.  Patient Active Problem List   Diagnosis Date Noted  . Acute on chronic cholecystitis s/p lap cholecystectomy 01/06/2018 01/07/2018  . Choledocholithiasis s/p ERCP 01/05/2018 01/04/2018  . Elevated LFTs 01/04/2018  . Vaginal delivery 03/06/2016    Past Surgical History:  Procedure Laterality Date  . CHOLECYSTECTOMY N/A 01/06/2018   Procedure: LAPAROSCOPIC CHOLECYSTECTOMY;  Surgeon: Stark Klein, MD;  Location: [facility];  Service: General;  Laterality: N/A;  . ERCP N/A 01/05/2018   Procedure: ENDOSCOPIC RETROGRADE CHOLANGIOPANCREATOGRAPHY (ERCP);  Surgeon: Ronnette Juniper, MD;  [facility];  Service: Gastroenterology;  Laterality: N/A;  . REMOVAL OF STONES  01/05/2018   Procedure: REMOVAL OF STONES;  Surgeon: Ronnette Juniper, MD;  Location: [facility];  Service: Gastroenterology;;  . Joan Mayans  01/05/2018   Procedure: Joan Mayans;  Surgeon: Ronnette Juniper, MD;  Location: [facility];  Service: Gastroenterology;;    OB History    Gravida  2   Para  1   Term  1   Preterm  0   AB  0   Living  1     SAB  0   TAB  0   Ectopic  0   Multiple      Live Births  1            Home Medications    Prior to Admission medications   Medication Sig Start Date End Date Taking? Authorizing Provider  ibuprofen (ADVIL,MOTRIN) 400 MG tablet Take 400 mg by mouth every 6 (six) hours as needed.    [provider]  PRESCRIPTION MEDICATION 1 Units. Explanon  implant    [provider]    Family History Family History  Problem Relation Age of Onset  . Hypertension Mother     Social History Social History   Tobacco Use  . Smoking status: Former Research scientist (life sciences)  . Smokeless tobacco: Former Network engineer Use Topics  . Alcohol use: No  . Drug use: No     Allergies   Patient has no known allergies.   Review of Systems Review of Systems  Constitutional:       Patient states that she is without symptoms     Physical Exam Triage Vital Signs ED Triage Vitals [11/25/19 1616]  Enc Vitals Group     BP (!) 149/98     Pulse Rate 81     Resp 18     Temp 99.3 F (37.4 C)     Temp Source Oral     SpO2 98 %     Weight      Height      Head Circumference      Peak Flow      Pain Score 0     Pain Loc      Pain Edu?      Excl. in [place]?    No data found.  Updated Vital Signs BP (!) 149/98 (BP Location: Right Arm)   Pulse  81   Temp 99.3 F (37.4 C) (Oral)   Resp 18   LMP 11/21/2019   SpO2 98%     Physical Exam Constitutional:      General: She is not in acute distress.    Appearance: She is well-developed.     Comments: Appears well  HENT:     Head: Normocephalic and atraumatic.  Eyes:     Conjunctiva/sclera: Conjunctivae normal.     Pupils: Pupils are equal, round, and reactive to light.  Cardiovascular:     Rate and Rhythm: Normal rate.  Pulmonary:     Effort: Pulmonary effort is normal. No respiratory distress.  Abdominal:     General: There is no distension.     Palpations: Abdomen is soft.  Genitourinary:    Comments: Vaginal self swab discussed Musculoskeletal:        General: Normal range of motion.     Cervical back: Normal range of motion.  Skin:    General: Skin is warm and dry.  Neurological:     Mental Status: She is alert.  Psychiatric:        Mood and Affect: Mood normal.        Behavior: Behavior normal.      UC Treatments / Results  Labs (all labs ordered are listed, but only  abnormal results are displayed) Labs Reviewed  RPR  HIV ANTIBODY (ROUTINE TESTING W REFLEX)  CERVICOVAGINAL ANCILLARY ONLY    EKG   Radiology No results found.  Procedures Procedures (including critical care time)  Medications Ordered in UC Medications - No data to display  Initial Impression / Assessment and Plan / UC Course  I have reviewed the triage vital signs and the nursing notes.  Pertinent labs & imaging results that were available during my care of the patient were reviewed by me and considered in my medical decision making (see chart for details).     Reviewed STD exposure/test results/treatment Final Clinical Impressions(s) / UC Diagnoses   Final diagnoses:  Possible exposure to STD     Discharge Instructions     You can check for the test results on My Chart You will be called if any tests are positive    ED Prescriptions    None     PDMP not reviewed this encounter.   Eustace Moore, MD 11/25/19 [phone]

## 2019-11-25 NOTE — Discharge Instructions (Signed)
You can check for the test results on My Chart You will be called if any tests are positive

## 2019-11-26 LAB — RPR: RPR Ser Ql: NONREACTIVE

## 2019-11-26 LAB — HIV ANTIBODY (ROUTINE TESTING W REFLEX): HIV Screen 4th Generation wRfx: NONREACTIVE

## 2019-11-26 LAB — CERVICOVAGINAL ANCILLARY ONLY
Chlamydia: NEGATIVE
Comment: NEGATIVE
Comment: NEGATIVE
Comment: NORMAL
Neisseria Gonorrhea: NEGATIVE
Trichomonas: NEGATIVE

## 2020-02-19 ENCOUNTER — Encounter (HOSPITAL_COMMUNITY): Payer: Self-pay

## 2020-02-19 ENCOUNTER — Other Ambulatory Visit: Payer: Self-pay

## 2020-02-19 ENCOUNTER — Ambulatory Visit (HOSPITAL_COMMUNITY)
Admission: RE | Admit: 2020-02-19 | Discharge: 2020-02-19 | Disposition: A | Discharge status: Home / Self Care | Payer: Medicaid Other | Source: Ambulatory Visit | Attending: Family Medicine | Admitting: Family Medicine

## 2020-02-19 VITALS — BP 124/85 | HR 86 | Temp 98.4°F | Resp 16

## 2020-02-19 DIAGNOSIS — K645 Perianal venous thrombosis: Secondary | ICD-10-CM | POA: Diagnosis not present

## 2020-02-19 MED ORDER — HYDROCORTISONE (PERIANAL) 2.5 % EX CREA: 1.0000 "application " | TOPICAL_CREAM | Freq: Two times a day (BID) | CUTANEOUS | 0 refills | Status: DC

## 2020-02-19 NOTE — ED Triage Notes (Signed)
Pt presents to UC for hemorrhoids x 3 days. Pt states hemorrhoids are painful, not bleeding. Pt states she has been treating with ice and aloe vera.

## 2020-02-19 NOTE — Discharge Instructions (Signed)
See provided information about hemorrhoids.  Can use the provided cream to try to help some with symptoms.  Use of a donut style pillow to sit may be helpful.  You may be able to have this drained with general surgery, if interested in this I would call their office tomorrow to try to set up appointment.  Managing your stools so they are less frequent may help in prevention of hemorrhoids. Use of metamucil may help to add bulk to decrease your loose stools.

## 2020-02-21 NOTE — ED Provider Notes (Signed)
[facility]    CSN: [id number] Arrival date & time: 02/19/20  4967      History   Chief Complaint Chief Complaint  Patient presents with  . Hemorrhoids    HPI Jennifer Hunter is a 19 y.o. female.   Arvis Cruz-Granados presents with complaints of painful hemorrhoid. Denies any previous similar. Pain with walking and sitting, to rectum. Noted it 8/2. No bleeding. No pelvic pain. She states she has chronic diarrhea, ever since she had her gallbladder removed a few years ago she has had looser stool. Last week she had worse diarrhea after her child did as well, with suspected viral illness. She has applied ice and aloe vera to her rectum which haven't helped.    ROS per HPI, negative if not otherwise mentioned.      History reviewed. No pertinent past medical history.  Patient Active Problem List   Diagnosis Date Noted  . Acute on chronic cholecystitis s/p lap cholecystectomy 01/06/2018 01/07/2018  . Choledocholithiasis s/p ERCP 01/05/2018 01/04/2018  . Elevated LFTs 01/04/2018  . Vaginal delivery 03/06/2016    Past Surgical History:  Procedure Laterality Date  . CHOLECYSTECTOMY N/A 01/06/2018   Procedure: LAPAROSCOPIC CHOLECYSTECTOMY;  Surgeon: Almond Lint, MD;  Location: [facility];  Service: General;  Laterality: N/A;  . ERCP N/A 01/05/2018   Procedure: ENDOSCOPIC RETROGRADE CHOLANGIOPANCREATOGRAPHY (ERCP);  Surgeon: Kerin Salen, MD;  Location: [facility];  Service: Gastroenterology;  Laterality: N/A;  . REMOVAL OF STONES  01/05/2018   Procedure: REMOVAL OF STONES;  Surgeon: Kerin Salen, MD;  Location: [facility];  Service: Gastroenterology;;  . Dennison Mascot  01/05/2018   Procedure: Dennison Mascot;  Surgeon: Kerin Salen, MD;  Location: [facility];  Service: Gastroenterology;;    OB History    Gravida  2   Para  1   Term  1   Preterm  0   AB  0   Living  1     SAB  0   TAB  0   Ectopic  0   Multiple      Live Births  1             Home Medications    Prior to Admission medications   Medication Sig Start Date End Date Taking? Authorizing Provider  hydrocortisone (ANUSOL-HC) 2.5 % rectal cream Place 1 application rectally 2 (two) times daily. 02/19/20   Georgetta Haber, NP  ibuprofen (ADVIL,MOTRIN) 400 MG tablet Take 400 mg by mouth every 6 (six) hours as needed.    [provider]  PRESCRIPTION MEDICATION 1 Units. Explanon implant    [provider]    Family History Family History  Problem Relation Age of Onset  . Hypertension Mother     Social History Social History   Tobacco Use  . Smoking status: Former Games developer  . Smokeless tobacco: Former Engineer, water Use Topics  . Alcohol use: No  . Drug use: No     Allergies   Patient has no known allergies.   Review of Systems Review of Systems   Physical Exam Triage Vital Signs ED Triage Vitals  Enc Vitals Group     BP 02/19/20 1911 124/85     Pulse Rate 02/19/20 1911 86     Resp 02/19/20 1911 16     Temp 02/19/20 1911 98.4 F (36.9 C)     Temp Source 02/19/20 1911 Oral     SpO2 02/19/20 1911 96 %     Weight --  Height --      Head Circumference --      Peak Flow --      Pain Score 02/19/20 1916 5     Pain Loc --      Pain Edu? --      Excl. in GC? --    No data found.  Updated Vital Signs BP 124/85 (BP Location: Left Arm)   Pulse 86   Temp 98.4 F (36.9 C) (Oral)   Resp 16   SpO2 96%    Physical Exam Constitutional:      General: She is not in acute distress.    Appearance: She is well-developed.  Cardiovascular:     Rate and Rhythm: Normal rate.  Pulmonary:     Effort: Pulmonary effort is normal.  Genitourinary:    Rectum: External hemorrhoid present.     Comments: Approximately 1cm thrombosed hemorrhoid  Skin:    General: Skin is warm and dry.  Neurological:     Mental Status: She is alert and oriented to person, place, and time.      UC Treatments / Results  Labs (all labs  ordered are listed, but only abnormal results are displayed) Labs Reviewed - No data to display  EKG   Radiology No results found.  Procedures Procedures (including critical care time)  Medications Ordered in UC Medications - No data to display  Initial Impression / Assessment and Plan / UC Course  I have reviewed the triage vital signs and the nursing notes.  Pertinent labs & imaging results that were available during my care of the patient were reviewed by me and considered in my medical decision making (see chart for details).     1 thrombosed external hemorrhoid which is painful. New onset for patient. Unfortunately has had chronic loose stools. Discussed treatment options and prevention, encouraged follow up with general surgery as may be able to get this extracted. Patient verbalized understanding and agreeable to plan.    Final Clinical Impressions(s) / UC Diagnoses   Final diagnoses:  Thrombosed external hemorrhoid     Discharge Instructions     See provided information about hemorrhoids.  Can use the provided cream to try to help some with symptoms.  Use of a donut style pillow to sit may be helpful.  You may be able to have this drained with general surgery, if interested in this I would call their office tomorrow to try to set up appointment.  Managing your stools so they are less frequent may help in prevention of hemorrhoids. Use of metamucil may help to add bulk to decrease your loose stools.     ED Prescriptions    Medication Sig Dispense Auth. Provider   hydrocortisone (ANUSOL-HC) 2.5 % rectal cream Place 1 application rectally 2 (two) times daily. 30 g Georgetta Haber, NP     PDMP not reviewed this encounter.   Linus Mako B, NP 02/21/20 0900

## 2020-04-01 ENCOUNTER — Emergency Department (HOSPITAL_COMMUNITY)
Admission: EM | Admit: 2020-04-01 | Discharge: 2020-04-02 | Disposition: A | Discharge status: Home / Self Care | Payer: Medicaid Other | Attending: Emergency Medicine | Admitting: Emergency Medicine

## 2020-04-01 ENCOUNTER — Encounter (HOSPITAL_COMMUNITY): Payer: Self-pay

## 2020-04-01 ENCOUNTER — Other Ambulatory Visit: Payer: Self-pay

## 2020-04-01 ENCOUNTER — Emergency Department (HOSPITAL_COMMUNITY): Payer: Medicaid Other

## 2020-04-01 DIAGNOSIS — L03011 Cellulitis of right finger: Secondary | ICD-10-CM | POA: Insufficient documentation

## 2020-04-01 DIAGNOSIS — Z87891 Personal history of nicotine dependence: Secondary | ICD-10-CM | POA: Insufficient documentation

## 2020-04-01 MED ORDER — LIDOCAINE HCL (PF) 1 % IJ SOLN
5.0000 mL | Freq: Once | INTRAMUSCULAR | Status: AC
  Administered 2020-04-02: 5 mL
  Filled 2020-04-01: qty 30

## 2020-04-01 MED ORDER — SODIUM BICARBONATE 4 % IV SOLN
5.0000 mL | Freq: Once | INTRAVENOUS | Status: DC
  Filled 2020-04-01: qty 5

## 2020-04-01 MED ORDER — SODIUM BICARBONATE 4.2 % IV SOLN
5.0000 mL | Freq: Once | INTRAVENOUS | Status: DC
  Filled 2020-04-01: qty 5

## 2020-04-01 MED ORDER — DOXYCYCLINE HYCLATE 100 MG PO CAPS: 100.0000 mg | ORAL_CAPSULE | Freq: Two times a day (BID) | ORAL | 0 refills | Status: DC

## 2020-04-01 MED ORDER — HYDROCODONE-ACETAMINOPHEN 5-325 MG PO TABS: 1.0000 | ORAL_TABLET | Freq: Four times a day (QID) | ORAL | 0 refills | Status: DC | PRN

## 2020-04-01 NOTE — ED Provider Notes (Addendum)
[facility]-EMERGENCY DEPT Provider Note   CSN: [id number] Arrival date & time: 04/01/20  1957     History Chief Complaint  Patient presents with  . Hand Injury    Jennifer Hunter is a 19 y.o. female.  HPI Patient presents with right middle finger pain.  Thinks potentially she had on something on Sunday when the pain began but does not remember actually hitting it on anything.  Distal part of the fingers become swollen and painful.  No drainage.  She had a acrylic nail that she has since removed.  No fevers.  Hurts to move the finger.  Has not had problems like this before.    History reviewed. No pertinent past medical history.  Patient Active Problem List   Diagnosis Date Noted  . Acute on chronic cholecystitis s/p lap cholecystectomy 01/06/2018 01/07/2018  . Choledocholithiasis s/p ERCP 01/05/2018 01/04/2018  . Elevated LFTs 01/04/2018  . Vaginal delivery 03/06/2016    Past Surgical History:  Procedure Laterality Date  . CHOLECYSTECTOMY N/A 01/06/2018   Procedure: LAPAROSCOPIC CHOLECYSTECTOMY;  Surgeon: Almond Lint, MD;  Location: [facility];  Service: General;  Laterality: N/A;  . ERCP N/A 01/05/2018   Procedure: ENDOSCOPIC RETROGRADE CHOLANGIOPANCREATOGRAPHY (ERCP);  Surgeon: Kerin Salen, MD;  Location: [facility];  Service: Gastroenterology;  Laterality: N/A;  . REMOVAL OF STONES  01/05/2018   Procedure: REMOVAL OF STONES;  Surgeon: Kerin Salen, MD;  Location: [facility];  Service: Gastroenterology;;  . Dennison Mascot  01/05/2018   Procedure: Dennison Mascot;  Surgeon: Kerin Salen, MD;  Location: [facility];  Service: Gastroenterology;;     OB History    Gravida  2   Para  1   Term  1   Preterm  0   AB  0   Living  1     SAB  0   TAB  0   Ectopic  0   Multiple      Live Births  1           Family History  Problem Relation Age of Onset  . Hypertension Mother     Social History   Tobacco Use  . Smoking status:  Former Games developer  . Smokeless tobacco: Former Engineer, water Use Topics  . Alcohol use: No  . Drug use: No    Home Medications Prior to Admission medications   Medication Sig Start Date End Date Taking? Authorizing Provider  doxycycline (VIBRAMYCIN) 100 MG capsule Take 1 capsule (100 mg total) by mouth 2 (two) times daily. 04/01/20   Benjiman Core, MD  HYDROcodone-acetaminophen (NORCO/VICODIN) 5-325 MG tablet Take 1-2 tablets by mouth every 6 (six) hours as needed. 04/01/20   Benjiman Core, MD  hydrocortisone (ANUSOL-HC) 2.5 % rectal cream Place 1 application rectally 2 (two) times daily. 02/19/20   Georgetta Haber, NP  ibuprofen (ADVIL,MOTRIN) 400 MG tablet Take 400 mg by mouth every 6 (six) hours as needed.    [provider]  PRESCRIPTION MEDICATION 1 Units. Explanon implant    [provider]    Allergies    Patient has no known allergies.  Review of Systems   Review of Systems  Constitutional: Negative for appetite change and fever.  Musculoskeletal:       Swelling and pain of distal phalanx of third finger on right hand  Skin: Positive for color change.  Neurological: Negative for weakness.  Psychiatric/Behavioral: Negative for confusion.    Physical Exam Updated Vital Signs BP 121/90 (BP Location: Left Arm)  Pulse 71   Temp 98.4 F (36.9 C) (Oral)   Resp 14   Ht 5\' 2"  (1.575 m)   Wt 60.3 kg   SpO2 100%   BMI 24.33 kg/m   Physical Exam Vitals and nursing note reviewed.  HENT:     Head: Normocephalic.  Cardiovascular:     Rate and Rhythm: Regular rhythm.  Pulmonary:     Breath sounds: No rhonchi.  Abdominal:     Tenderness: There is no abdominal tenderness.  Musculoskeletal:        General: Tenderness present.     Comments: Swelling of the distal phalanx of the right third finger.  Some erythema particularly medially with some possible purulence along the nail fold and lateral aspect.  Less swelling along the volar side.  Skin:     General: Skin is warm.     Capillary Refill: Capillary refill takes less than 2 seconds.  Neurological:     Mental Status: She is alert and oriented to person, place, and time.     ED Results / Procedures / Treatments   Labs (all labs ordered are listed, but only abnormal results are displayed) Labs Reviewed - No data to display  EKG None  Radiology DG Finger Middle Right  Result Date: 04/01/2020 CLINICAL DATA:  Swelling without trauma, discoloration at nailbed EXAM: RIGHT MIDDLE FINGER 2+V COMPARISON:  None. FINDINGS: Frontal, oblique, and lateral views of the right third digit are obtained. No fracture, subluxation, or dislocation. Joint spaces are well preserved. Soft tissues are normal. IMPRESSION: 1. Unremarkable right third digit. Electronically Signed   By: 04/03/2020 M.D.   On: 04/01/2020 20:37    Procedures .09/17/2021Incision and Drainage  Date/Time: 04/01/2020 11:59 PM Performed by: 04/03/2020, MD Authorized by: Benjiman Core, MD   Consent:    Consent obtained:  Verbal   Consent given by:  Patient   Risks discussed:  Bleeding, incomplete drainage, pain and infection   Alternatives discussed:  No treatment, delayed treatment, alternative treatment and observation Location:    Type:  Abscess   Location:  Upper extremity   Upper extremity location:  Finger   Finger location:  R long finger Pre-procedure details:    Skin preparation:  Hibiclens Anesthesia (see MAR for exact dosages):    Anesthesia method:  Nerve block   Block location:  Digital block   Block needle gauge:  27 G   Block anesthetic:  Lidocaine 1% w/o epi   Block injection procedure:  Anatomic landmarks identified, introduced needle, incremental injection, anatomic landmarks palpated and negative aspiration for blood   Block outcome:  Incomplete block Procedure type:    Complexity:  Simple Procedure details:    Needle aspiration: no     Incision types:  Single straight   Scalpel blade:   11   Drainage:  Purulent and bloody   Drainage amount:  Scant   Wound treatment:  Wound left open   Packing materials:  None   (including critical care time)  Medications Ordered in ED Medications  lidocaine (PF) (XYLOCAINE) 1 % injection 5 mL (has no administration in time range)  sodium bicarbonate 4.2 % injection 5 mL (has no administration in time range)    ED Course  I have reviewed the triage vital signs and the nursing notes.  Pertinent labs & imaging results that were available during my care of the patient were reviewed by me and considered in my medical decision making (see chart for details).  MDM Rules/Calculators/A&P                         Patient with swelling of third finger.  Appears to have cellulitis with some component of potentially paronychia and potentially felon.  Drainage of paronychia done with only mild amount of purulence although did have difficulty gaining anesthesia.  Will treat with antibiotics.  Will also have warm soaks to help with further drainage.  Will have follow-up with a hand surgeon in 1 to 2 days.  Does not appear to have more of an infection tracking up the finger.  Final Clinical Impression(s) / ED Diagnoses Final diagnoses:  Paronychia of right middle finger    Rx / DC Orders ED Discharge Orders         Ordered    doxycycline (VIBRAMYCIN) 100 MG capsule  2 times daily        04/01/20 2356    HYDROcodone-acetaminophen (NORCO/VICODIN) 5-325 MG tablet  Every 6 hours PRN        04/01/20 2356           Benjiman Core, MD 04/01/20 2358    Benjiman Core, MD 04/02/20 0000

## 2020-04-01 NOTE — ED Notes (Signed)
Provider at pt bedside  

## 2020-04-01 NOTE — ED Triage Notes (Signed)
Patient arrived stating on Sunday her middle finger on her right hand began to hurt. Reports taking OTC medication and using ice with little relief. Unsure if she hit her hand on anything. Patient acrylic nails that were done 2 weeks ago, artifical nail has been removed.

## 2020-04-02 MED ORDER — OXYCODONE-ACETAMINOPHEN 5-325 MG PO TABS
1.0000 | ORAL_TABLET | Freq: Once | ORAL | Status: AC
  Administered 2020-04-02: 1 via ORAL
  Filled 2020-04-02: qty 1

## 2020-04-25 ENCOUNTER — Ambulatory Visit (HOSPITAL_COMMUNITY): Admit: 2020-04-25 | Disposition: A | Discharge status: Still In Hospital | Payer: Medicaid Other

## 2020-04-25 ENCOUNTER — Other Ambulatory Visit: Payer: Self-pay

## 2020-04-25 ENCOUNTER — Ambulatory Visit (HOSPITAL_COMMUNITY)
Admission: EM | Admit: 2020-04-25 | Discharge: 2020-04-25 | Disposition: A | Discharge status: Home / Self Care | Payer: Medicaid Other | Attending: Family Medicine | Admitting: Family Medicine

## 2020-04-25 DIAGNOSIS — R110 Nausea: Secondary | ICD-10-CM

## 2020-04-25 DIAGNOSIS — Z87891 Personal history of nicotine dependence: Secondary | ICD-10-CM | POA: Diagnosis not present

## 2020-04-25 DIAGNOSIS — H5710 Ocular pain, unspecified eye: Secondary | ICD-10-CM | POA: Insufficient documentation

## 2020-04-25 DIAGNOSIS — Z79899 Other long term (current) drug therapy: Secondary | ICD-10-CM | POA: Diagnosis not present

## 2020-04-25 DIAGNOSIS — R519 Headache, unspecified: Secondary | ICD-10-CM | POA: Diagnosis not present

## 2020-04-25 DIAGNOSIS — U071 COVID-19: Secondary | ICD-10-CM | POA: Diagnosis not present

## 2020-04-25 MED ORDER — ONDANSETRON 4 MG PO TBDP: 4.0000 mg | ORAL_TABLET | Freq: Three times a day (TID) | ORAL | 0 refills | Status: DC | PRN

## 2020-04-25 MED ORDER — PREDNISONE 20 MG PO TABS: 40.0000 mg | ORAL_TABLET | Freq: Every day | ORAL | 0 refills | Status: DC

## 2020-04-25 NOTE — ED Triage Notes (Signed)
Patient in with c/o bilateral eye pain and headaches that started 4 days ago upon waking. States it feels like pressure when she looks up/down and side to side. Also c/o runny nose  Patient took tylenol and goodies powder today with some temporary relief.   Denies vision changes, n/v, photosensitivity, fever, neck pain

## 2020-04-26 LAB — SARS CORONAVIRUS 2 (TAT 6-24 HRS): SARS Coronavirus 2: POSITIVE — AB

## 2020-04-26 NOTE — ED Provider Notes (Signed)
[facility]    CSN: [id number] Arrival date & time: 04/25/20  1519      History   Chief Complaint Chief Complaint  Patient presents with   Eye Pain   Headache    HPI Jennifer Hunter is a 19 y.o. female.   Here today with 3-4 days of pain and pressure behind eyes, headache, fatigue, nausea. Denies fever, chills, cough, CP, SOB, diarrhea, rashes. Has been taking tylenol without much benefit. No known sick contacts, no past history of migraines or allergic rhinitis. Tolerating PO but not much appetite.      No past medical history on file.  Patient Active Problem List   Diagnosis Date Noted   Acute on chronic cholecystitis s/p lap cholecystectomy 01/06/2018 01/07/2018   Choledocholithiasis s/p ERCP 01/05/2018 01/04/2018   Elevated LFTs 01/04/2018   Vaginal delivery 03/06/2016    Past Surgical History:  Procedure Laterality Date   CHOLECYSTECTOMY N/A 01/06/2018   Procedure: LAPAROSCOPIC CHOLECYSTECTOMY;  Surgeon: Almond Lint, MD;  Location: [facility];  Service: General;  Laterality: N/A;   ERCP N/A 01/05/2018   Procedure: ENDOSCOPIC RETROGRADE CHOLANGIOPANCREATOGRAPHY (ERCP);  Surgeon: Kerin Salen, MD;  Location: [facility];  Service: Gastroenterology;  Laterality: N/A;   REMOVAL OF STONES  01/05/2018   Procedure: REMOVAL OF STONES;  Surgeon: Kerin Salen, MD;  Location: [facility];  Service: Gastroenterology;;   Dennison Mascot  01/05/2018   Procedure: Dennison Mascot;  Surgeon: Kerin Salen, MD;  Location: [facility];  Service: Gastroenterology;;    OB History    Gravida  2   Para  1   Term  1   Preterm  0   AB  0   Living  1     SAB  0   TAB  0   Ectopic  0   Multiple      Live Births  1            Home Medications    Prior to Admission medications   Medication Sig Start Date End Date Taking? Authorizing Provider  doxycycline (VIBRAMYCIN) 100 MG capsule Take 1 capsule (100 mg total) by mouth 2 (two) times daily.  04/01/20   Benjiman Core, MD  HYDROcodone-acetaminophen (NORCO/VICODIN) 5-325 MG tablet Take 1-2 tablets by mouth every 6 (six) hours as needed. 04/01/20   Benjiman Core, MD  hydrocortisone (ANUSOL-HC) 2.5 % rectal cream Place 1 application rectally 2 (two) times daily. 02/19/20   Georgetta Haber, NP  ibuprofen (ADVIL,MOTRIN) 400 MG tablet Take 400 mg by mouth every 6 (six) hours as needed.    [provider]  ondansetron (ZOFRAN ODT) 4 MG disintegrating tablet Take 1 tablet (4 mg total) by mouth every 8 (eight) hours as needed for nausea or vomiting. 04/25/20   Particia Nearing, PA-C  predniSONE (DELTASONE) 20 MG tablet Take 2 tablets (40 mg total) by mouth daily with breakfast. 04/25/20   Particia Nearing, PA-C  PRESCRIPTION MEDICATION 1 Units. Explanon implant    [provider]    Family History Family History  Problem Relation Age of Onset   Hypertension Mother     Social History Social History   Tobacco Use   Smoking status: Former Smoker   Smokeless tobacco: Former Neurosurgeon  Substance Use Topics   Alcohol use: No   Drug use: No     Allergies   Patient has no known allergies.   Review of Systems Review of Systems PER HPI    Physical Exam Triage Vital Signs ED  Triage Vitals [04/25/20 1642]  Enc Vitals Group     BP 116/85     Pulse Rate 80     Resp 16     Temp 98.6 F (37 C)     Temp Source Oral     SpO2 99 %     Weight      Height      Head Circumference      Peak Flow      Pain Score 5     Pain Loc      Pain Edu?      Excl. in GC?    No data found.  Updated Vital Signs BP 116/85 (BP Location: Left Arm)    Pulse 80    Temp 98.6 F (37 C) (Oral)    Resp 16    LMP 04/03/2020 (Exact Date)    SpO2 99%   Visual Acuity Right Eye Distance:   Left Eye Distance:   Bilateral Distance:    Right Eye Near:   Left Eye Near:    Bilateral Near:     Physical Exam Vitals and nursing note reviewed.  Constitutional:       Appearance: Normal appearance. She is not ill-appearing.  HENT:     Head: Atraumatic.     Right Ear: Tympanic membrane normal.     Left Ear: Tympanic membrane normal.     Nose:     Comments: Nasal mucosa erythematous and edematous    Mouth/Throat:     Mouth: Mucous membranes are moist.     Pharynx: Oropharynx is clear.  Eyes:     Extraocular Movements: Extraocular movements intact.     Conjunctiva/sclera: Conjunctivae normal.  Cardiovascular:     Rate and Rhythm: Normal rate and regular rhythm.     Heart sounds: Normal heart sounds.  Pulmonary:     Effort: Pulmonary effort is normal.     Breath sounds: Normal breath sounds.  Abdominal:     General: Bowel sounds are normal.     Palpations: Abdomen is soft.     Tenderness: There is no abdominal tenderness. There is no right CVA tenderness, left CVA tenderness or guarding.  Musculoskeletal:        General: Normal range of motion.     Cervical back: Normal range of motion and neck supple.  Skin:    General: Skin is warm and dry.  Neurological:     General: No focal deficit present.     Mental Status: She is alert and oriented to person, place, and time.     Motor: No weakness.     Gait: Gait normal.  Psychiatric:        Mood and Affect: Mood normal.        Thought Content: Thought content normal.        Judgment: Judgment normal.    UC Treatments / Results  Labs (all labs ordered are listed, but only abnormal results are displayed) Labs Reviewed  SARS CORONAVIRUS 2 (TAT 6-24 HRS) - Abnormal; Notable for the following components:      Result Value   SARS Coronavirus 2 POSITIVE (*)    All other components within normal limits    EKG   Radiology No results found.  Procedures Procedures (including critical care time)  Medications Ordered in UC Medications - No data to display  Initial Impression / Assessment and Plan / UC Course  I have reviewed the triage vital signs and the nursing notes.  Pertinent labs &  imaging results that were available during my care of the patient were reviewed by me and considered in my medical decision making (see chart for details).     Viral/Sinusitis vs migraine - will tx with prednisone and zofran, discussed flonase and sudafed in case sinuses are causing some issues. COVID test pending, letter given while awaiting results and isolation protocol reviewed. Discussed strict return precautions   Final Clinical Impressions(s) / UC Diagnoses   Final diagnoses:  Acute nonintractable headache, unspecified headache type  Nausea without vomiting   Discharge Instructions   None    ED Prescriptions    Medication Sig Dispense Auth. Provider   predniSONE (DELTASONE) 20 MG tablet Take 2 tablets (40 mg total) by mouth daily with breakfast. 10 tablet Particia Nearing, PA-C   ondansetron (ZOFRAN ODT) 4 MG disintegrating tablet Take 1 tablet (4 mg total) by mouth every 8 (eight) hours as needed for nausea or vomiting. 20 tablet Particia Nearing, [place]     PDMP not reviewed this encounter.   Particia Nearing, [place] 04/26/20 1014

## 2020-04-27 ENCOUNTER — Telehealth (HOSPITAL_COMMUNITY): Payer: Self-pay | Admitting: Adult Health

## 2020-04-27 NOTE — Telephone Encounter (Signed)
Called and LMOM regarding monoclonal antibody treatment for COVID 19 given to those who are at risk for complications and/or hospitalization of the virus.  Patient meets criteria based on: former smoker  Call back number given: [phone]  My chart message: sent  Lillard Anes, NP

## 2021-08-23 ENCOUNTER — Other Ambulatory Visit: Payer: Self-pay

## 2021-08-23 ENCOUNTER — Ambulatory Visit (HOSPITAL_COMMUNITY)
Admission: RE | Admit: 2021-08-23 | Discharge: 2021-08-23 | Disposition: A | Discharge status: Home / Self Care | Payer: Medicaid Other | Source: Ambulatory Visit | Attending: Family Medicine | Admitting: Family Medicine

## 2021-08-23 VITALS — BP 130/89 | HR 72 | Temp 98.3°F | Resp 16

## 2021-08-23 DIAGNOSIS — K5900 Constipation, unspecified: Secondary | ICD-10-CM

## 2021-08-23 MED ORDER — LACTULOSE 10 GM/15ML PO SOLN: 10.0000 g | Freq: Three times a day (TID) | ORAL | 0 refills | Status: AC

## 2021-08-23 NOTE — ED Provider Notes (Signed)
[facility]    CSN: [id number] Arrival date & time: 08/23/21  1802      History   Chief Complaint Chief Complaint  Patient presents with   Constipation    HPI Jennifer Hunter is a 21 y.o. female.    Constipation Here for 1 month of new constipation.  Her last bowel movement was 1 month ago.  She has tried using a week of MiraLAX and it did not help.  She then tried detox teas; that also did not help.  No fever or vomiting.  No nausea.  She did start doing meal prep with more vegetables in her diet when this began.  Prior to this time she had actually had 2 or 3 stools a day because she had had her gallbladder out and would have a bowel movement after every meal  She is currently on her period  No past medical history on file.  Patient Active Problem List   Diagnosis Date Noted   Acute on chronic cholecystitis s/p lap cholecystectomy 01/06/2018 01/07/2018   Choledocholithiasis s/p ERCP 01/05/2018 01/04/2018   Elevated LFTs 01/04/2018   Vaginal delivery 03/06/2016    Past Surgical History:  Procedure Laterality Date   CHOLECYSTECTOMY N/A 01/06/2018   Procedure: LAPAROSCOPIC CHOLECYSTECTOMY;  Surgeon: Almond Lint, MD;  Location: [facility];  Service: General;  Laterality: N/A;   ERCP N/A 01/05/2018   Procedure: ENDOSCOPIC RETROGRADE CHOLANGIOPANCREATOGRAPHY (ERCP);  Surgeon: Kerin Salen, MD;  Location: [facility];  Service: Gastroenterology;  Laterality: N/A;   REMOVAL OF STONES  01/05/2018   Procedure: REMOVAL OF STONES;  Surgeon: Kerin Salen, MD;  Location: [facility];  Service: Gastroenterology;;   Dennison Mascot  01/05/2018   Procedure: Dennison Mascot;  Surgeon: Kerin Salen, MD;  Location: [facility];  Service: Gastroenterology;;    OB History     Gravida  2   Para  1   Term  1   Preterm  0   AB  0   Living  1      SAB  0   IAB  0   Ectopic  0   Multiple      Live Births  1            Home Medications    Prior to  Admission medications   Medication Sig Start Date End Date Taking? Authorizing Provider  lactulose (CHRONULAC) 10 GM/15ML solution Take 15-30 mLs (10-20 g total) by mouth 3 (three) times daily. 08/23/21  Yes Zenia Resides, MD  PRESCRIPTION MEDICATION 1 Units. Explanon implant    [provider]    Family History Family History  Problem Relation Age of Onset   Hypertension Mother     Social History Social History   Tobacco Use   Smoking status: Former   Smokeless tobacco: Former  Substance Use Topics   Alcohol use: No   Drug use: No     Allergies   Patient has no known allergies.   Review of Systems Review of Systems  Gastrointestinal:  Positive for constipation.    Physical Exam Triage Vital Signs ED Triage Vitals [08/23/21 1826]  Enc Vitals Group     BP 130/89     Pulse Rate 72     Resp 16     Temp 98.3 F (36.8 C)     Temp Source Oral     SpO2 100 %     Weight      Height      Head Circumference  Peak Flow      Pain Score      Pain Loc      Pain Edu?      Excl. in [place]?    No data found.  Updated Vital Signs BP 130/89 (BP Location: Left Arm)    Pulse 72    Temp 98.3 F (36.8 C) (Oral)    Resp 16    LMP 08/23/2021    SpO2 100%   Visual Acuity Right Eye Distance:   Left Eye Distance:   Bilateral Distance:    Right Eye Near:   Left Eye Near:    Bilateral Near:     Physical Exam Vitals reviewed.  Constitutional:      General: She is not in acute distress.    Appearance: She is not ill-appearing, toxic-appearing or diaphoretic.  HENT:     Right Ear: Tympanic membrane normal.     Left Ear: Tympanic membrane normal.     Nose: Nose normal.     Mouth/Throat:     Mouth: Mucous membranes are moist.     Pharynx: No oropharyngeal exudate or posterior oropharyngeal erythema.  Eyes:     Extraocular Movements: Extraocular movements intact.     Conjunctiva/sclera: Conjunctivae normal.     Pupils: Pupils are equal, round, and reactive  to light.  Cardiovascular:     Rate and Rhythm: Normal rate and regular rhythm.     Heart sounds: No murmur heard. Pulmonary:     Effort: Pulmonary effort is normal.     Breath sounds: Normal breath sounds. No stridor. No rhonchi or rales.  Abdominal:     Palpations: Abdomen is soft. There is no mass.     Tenderness: There is no abdominal tenderness.  Musculoskeletal:     Cervical back: Neck supple. No tenderness.  Lymphadenopathy:     Cervical: No cervical adenopathy.  Skin:    Capillary Refill: Capillary refill takes less than 2 seconds.     Coloration: Skin is not jaundiced or pale.  Neurological:     General: No focal deficit present.     Mental Status: She is alert and oriented to person, place, and time.  Psychiatric:        Behavior: Behavior normal.     UC Treatments / Results  Labs (all labs ordered are listed, but only abnormal results are displayed) Labs Reviewed - No data to display  EKG   Radiology No results found.  Procedures Procedures (including critical care time)  Medications Ordered in UC Medications - No data to display  Initial Impression / Assessment and Plan / UC Course  I have reviewed the triage vital signs and the nursing notes.  Pertinent labs & imaging results that were available during my care of the patient were reviewed by me and considered in my medical decision making (see chart for details).     I will have her try Dulcolax suppositories and enemas first.  Information given on gastro specialty and request made to find her a PCP Final Clinical Impressions(s) / UC Diagnoses   Final diagnoses:  Constipation, unspecified constipation type     Discharge Instructions      Try using a dulcolax suppository, and also you can use a fleet's enema to help relieve the constipation from below.   Take lactulose 10 g / 15 mL, 15 to 30 mL (1 to 2 tablespoons) 3 times daily for constipation     ED Prescriptions     Medication Sig  Dispense  Auth. Provider   lactulose (CHRONULAC) 10 GM/15ML solution Take 15-30 mLs (10-20 g total) by mouth 3 (three) times daily. 236 mL Barrett Henle, MD      PDMP not reviewed this encounter.   Barrett Henle, MD 08/23/21 [phone]

## 2021-08-23 NOTE — Discharge Instructions (Addendum)
Try using a dulcolax suppository, and also you can use a fleet's enema to help relieve the constipation from below.   Take lactulose 10 g / 15 mL, 15 to 30 mL (1 to 2 tablespoons) 3 times daily for constipation

## 2021-08-23 NOTE — ED Triage Notes (Signed)
Pt presents for constipation x 1 month. She has used miralax and detox teas with no relief.

## 2022-01-15 ENCOUNTER — Emergency Department (HOSPITAL_COMMUNITY): Payer: Medicaid Other

## 2022-01-15 ENCOUNTER — Other Ambulatory Visit: Payer: Self-pay

## 2022-01-15 ENCOUNTER — Encounter (HOSPITAL_COMMUNITY): Payer: Self-pay

## 2022-01-15 ENCOUNTER — Emergency Department (HOSPITAL_COMMUNITY)
Admission: EM | Admit: 2022-01-15 | Discharge: 2022-01-16 | Disposition: A | Discharge status: Home / Self Care | Payer: Medicaid Other | Attending: Emergency Medicine | Admitting: Emergency Medicine

## 2022-01-15 DIAGNOSIS — I880 Nonspecific mesenteric lymphadenitis: Secondary | ICD-10-CM | POA: Insufficient documentation

## 2022-01-15 DIAGNOSIS — R109 Unspecified abdominal pain: Secondary | ICD-10-CM | POA: Diagnosis present

## 2022-01-15 LAB — CBC WITH DIFFERENTIAL/PLATELET
Abs Immature Granulocytes: 0.02 10*3/uL (ref 0.00–0.07)
Basophils Absolute: 0 10*3/uL (ref 0.0–0.1)
Basophils Relative: 0 %
Eosinophils Absolute: 0 10*3/uL (ref 0.0–0.5)
Eosinophils Relative: 1 %
HCT: 39.8 % (ref 36.0–46.0)
Hemoglobin: 13.3 g/dL (ref 12.0–15.0)
Immature Granulocytes: 0 %
Lymphocytes Relative: 24 %
Lymphs Abs: 1.3 10*3/uL (ref 0.7–4.0)
MCH: 29.9 pg (ref 26.0–34.0)
MCHC: 33.4 g/dL (ref 30.0–36.0)
MCV: 89.4 fL (ref 80.0–100.0)
Monocytes Absolute: 0.5 10*3/uL (ref 0.1–1.0)
Monocytes Relative: 9 %
Neutro Abs: 3.6 10*3/uL (ref 1.7–7.7)
Neutrophils Relative %: 66 %
Platelets: 266 10*3/uL (ref 150–400)
RBC: 4.45 MIL/uL (ref 3.87–5.11)
RDW: 14.7 % (ref 11.5–15.5)
WBC: 5.5 10*3/uL (ref 4.0–10.5)
nRBC: 0 % (ref 0.0–0.2)

## 2022-01-15 LAB — COMPREHENSIVE METABOLIC PANEL
ALT: 14 U/L (ref 0–44)
AST: 17 U/L (ref 15–41)
Albumin: 4.1 g/dL (ref 3.5–5.0)
Alkaline Phosphatase: 64 U/L (ref 38–126)
Anion gap: 10 (ref 5–15)
BUN: 11 mg/dL (ref 6–20)
CO2: 23 mmol/L (ref 22–32)
Calcium: 9.6 mg/dL (ref 8.9–10.3)
Chloride: 107 mmol/L (ref 98–111)
Creatinine, Ser: 0.6 mg/dL (ref 0.44–1.00)
GFR, Estimated: >60 mL/min (ref >60)
Glucose, Bld: 99 mg/dL (ref 70–99)
Potassium: 3.6 mmol/L (ref 3.5–5.1)
Sodium: 140 mmol/L (ref 135–145)
Total Bilirubin: 0.7 mg/dL (ref 0.3–1.2)
Total Protein: 7.6 g/dL (ref 6.5–8.1)

## 2022-01-15 LAB — URINALYSIS, ROUTINE W REFLEX MICROSCOPIC
Bacteria, UA: NONE SEEN
Bilirubin Urine: NEGATIVE
Glucose, UA: NEGATIVE mg/dL
Ketones, ur: NEGATIVE mg/dL
Leukocytes,Ua: NEGATIVE
Nitrite: NEGATIVE
Protein, ur: NEGATIVE mg/dL
Specific Gravity, Urine: 1.001 — ABNORMAL LOW (ref 1.005–1.030)
pH: 8 (ref 5.0–8.0)

## 2022-01-15 LAB — I-STAT BETA HCG BLOOD, ED (MC, WL, AP ONLY): I-stat hCG, quantitative: <5 m[IU]/mL (ref <5)

## 2022-01-15 LAB — LIPASE, BLOOD: Lipase: 30 U/L (ref 11–51)

## 2022-01-15 NOTE — ED Triage Notes (Signed)
Pt reports with abdominal pain and lower back pain x 3 days.

## 2022-01-15 NOTE — ED Provider Triage Note (Signed)
Emergency Medicine Provider Triage Evaluation Note  Jennifer Hunter , a 21 y.o. female  was evaluated in triage.  Pt complains of low back pain, flank pain and groin pain.  Patient reports for the last 3 days she has had right-sided low back pain and left-sided flank and groin pain.  Patient reports no history of kidney stones.  Patient denies any dysuria, decreased urine stream, fevers.  Patient reports has a history of gallbladder removal and has had persistent diarrhea ever since then.  Review of Systems  Positive:  Negative:   Physical Exam  BP 138/90 (BP Location: Right Arm)   Pulse 78   Temp 99.6 F (37.6 C) (Oral)   Resp 17   Ht 5\' 2"  (1.575 m)   Wt 63.5 kg   SpO2 100%   BMI 25.61 kg/m  Gen:   Awake, no distress   Resp:  Normal effort  MSK:   Moves extremities without difficulty  Other:  Abdomen soft nontender.  No CVA tenderness bilaterally.  Medical Decision Making  Medically screening exam initiated at 8:31 PM.  Appropriate orders placed.  Jennifer Hunter was informed that the remainder of the evaluation will be completed by another provider, this initial triage assessment does not replace that evaluation, and the importance of remaining in the ED until their evaluation is complete.     Victorino Dike, PA-C 01/15/22 2032

## 2022-01-16 NOTE — ED Provider Notes (Signed)
[facility]-EMERGENCY DEPT Provider Note   CSN: [id number] Arrival date & time: 01/15/22  2013     History  Chief Complaint  Patient presents with   Abdominal Pain   Back Pain    Jennifer Hunter is a 21 y.o. female.  Presents to the emergency department for evaluation of back and abdominal pain.  Symptoms have been ongoing for couple of days.  Pain moves around, comes and goes.  No associated urinary symptoms.  No fever.       Home Medications Prior to Admission medications   Medication Sig Start Date End Date Taking? Authorizing Provider  lactulose (CHRONULAC) 10 GM/15ML solution Take 15-30 mLs (10-20 g total) by mouth 3 (three) times daily. 08/23/21   Zenia Resides, MD  PRESCRIPTION MEDICATION 1 Units. Explanon implant    [provider]      Allergies    Patient has no known allergies.    Review of Systems   Review of Systems  Physical Exam Updated Vital Signs BP 120/87   Pulse 80   Temp 99.6 F (37.6 C) (Oral)   Resp 18   Ht 5\' 2"  (1.575 m)   Wt 63.5 kg   SpO2 100%   BMI 25.61 kg/m  Physical Exam Vitals and nursing note reviewed.  Constitutional:      General: She is not in acute distress.    Appearance: She is well-developed.  HENT:     Head: Normocephalic and atraumatic.     Mouth/Throat:     Mouth: Mucous membranes are moist.  Eyes:     General: Vision grossly intact. Gaze aligned appropriately.     Extraocular Movements: Extraocular movements intact.     Conjunctiva/sclera: Conjunctivae normal.  Cardiovascular:     Rate and Rhythm: Normal rate and regular rhythm.     Pulses: Normal pulses.     Heart sounds: Normal heart sounds, S1 normal and S2 normal. No murmur heard.    No friction rub. No gallop.  Pulmonary:     Effort: Pulmonary effort is normal. No respiratory distress.     Breath sounds: Normal breath sounds.  Abdominal:     General: Bowel sounds are normal.     Palpations: Abdomen is soft.      Tenderness: There is no abdominal tenderness. There is no guarding or rebound.     Hernia: No hernia is present.  Musculoskeletal:        General: No swelling.     Cervical back: Full passive range of motion without pain, normal range of motion and neck supple. No spinous process tenderness or muscular tenderness. Normal range of motion.     Right lower leg: No edema.     Left lower leg: No edema.  Skin:    General: Skin is warm and dry.     Capillary Refill: Capillary refill takes less than 2 seconds.     Findings: No ecchymosis, erythema, rash or wound.  Neurological:     General: No focal deficit present.     Mental Status: She is alert and oriented to person, place, and time.     GCS: GCS eye subscore is 4. GCS verbal subscore is 5. GCS motor subscore is 6.     Cranial Nerves: Cranial nerves 2-12 are intact.     Sensory: Sensation is intact.     Motor: Motor function is intact.     Coordination: Coordination is intact.  Psychiatric:  Attention and Perception: Attention normal.        Mood and Affect: Mood normal.        Speech: Speech normal.        Behavior: Behavior normal.     ED Results / Procedures / Treatments   Labs (all labs ordered are listed, but only abnormal results are displayed) Labs Reviewed  URINALYSIS, ROUTINE W REFLEX MICROSCOPIC - Abnormal; Notable for the following components:      Result Value   Color, Urine COLORLESS (*)    Specific Gravity, Urine 1.001 (*)    Hgb urine dipstick SMALL (*)    All other components within normal limits  COMPREHENSIVE METABOLIC PANEL  LIPASE, BLOOD  CBC WITH DIFFERENTIAL/PLATELET  I-STAT BETA HCG BLOOD, ED ([facility], [facility], [facility] ONLY)    EKG None  Radiology CT Renal Stone Study  Result Date: 01/15/2022 CLINICAL DATA:  Flank pain, kidney stone suspected Right low back pain and left flank pain. EXAM: CT ABDOMEN AND PELVIS WITHOUT CONTRAST TECHNIQUE: Multidetector CT imaging of the abdomen and pelvis was performed  following the standard protocol without IV contrast. RADIATION DOSE REDUCTION: This exam was performed according to the departmental dose-optimization program which includes automated exposure control, adjustment of the mA and/or kV according to patient size and/or use of iterative reconstruction technique. COMPARISON:  Right upper quadrant ultrasound and MRCP 01/04/2018 FINDINGS: Lower chest: Clear lung bases. Lack of contrast limits assessment of the solid and hollow viscera. Hepatobiliary: No focal liver abnormality is seen. Status post cholecystectomy. No biliary dilatation. Pancreas: Pancreas is suboptimally assessed on this unenhanced exam. No obvious pancreatic inflammation. Spleen: Normal in size without focal abnormality. Adrenals/Urinary Tract: Normal adrenal glands. Punctate nonobstructing stone in the left kidney. No hydronephrosis. Indistinctness of the right renal sinus fat. No definite perinephric edema. The ureters are decompressed, no visualized ureteral stone. Partially distended urinary bladder, no bladder stone or wall thickening. Stomach/Bowel: Bowel assessment is limited in the absence of contrast and paucity of intra-abdominal fat. Stomach is grossly normal. No bowel obstruction. Occasional fluid-filled loops of small bowel in the pelvis, limited assessment for wall thickening on this unenhanced exam. Normal appendix. Small volume of colonic stool. Vascular/Lymphatic: Normal caliber abdominal aorta. There are multiple prominent ileocolic nodes that measure up to 12 mm. Smaller but still prominent central mesenteric nodes measuring up to 8 mm short axis. Nodal assessment is limited on this unenhanced exam. Reproductive: The uterus is retroverted. Limited assessment for adnexal mass. Small amount of free fluid in the dependent pelvis. Other: Small amount of free fluid in the pelvis. No upper abdominal ascites. No free air. No abdominal wall hernia. Musculoskeletal: There are no acute or  suspicious osseous abnormalities. IMPRESSION: 1. Punctate nonobstructing stone in the left kidney. No hydronephrosis or obstructive uropathy. 2. Indistinctness of the right renal sinus fat, can be seen with urinary tract infection. Recommend correlation with urinalysis. 3. Multiple prominent ileocolic and central mesenteric nodes, nonspecific, but can be seen with mesenteric adenitis. Electronically Signed   By: Narda Rutherford M.D.   On: 01/15/2022 21:07    Procedures Procedures    Medications Ordered in ED Medications - No data to display  ED Course/ Medical Decision Making/ A&P                           Medical Decision Making Amount and/or Complexity of Data Reviewed Labs: ordered.   Patient complaining of low back pain, diffuse abdominal pain.  Symptoms have been intermittent for 2 or 3 days.  Pain waxes and wanes, comes and goes.  Abdominal exam is benign.  Patient thoroughly evaluated.  Blood work unremarkable.  Urinalysis does not suggest infection.  CT scan shows mesenteric adenitis, no other abnormality.        Final Clinical Impression(s) / ED Diagnoses Final diagnoses:  Mesenteric adenitis    Rx / DC Orders ED Discharge Orders     None         Gilda Crease, MD 01/16/22 [phone]

## 2022-05-02 ENCOUNTER — Ambulatory Visit (INDEPENDENT_AMBULATORY_CARE_PROVIDER_SITE_OTHER): Payer: Medicaid Other

## 2022-05-02 ENCOUNTER — Encounter: Payer: Self-pay | Admitting: Physician Assistant

## 2022-05-02 ENCOUNTER — Ambulatory Visit (INDEPENDENT_AMBULATORY_CARE_PROVIDER_SITE_OTHER): Payer: Medicaid Other | Admitting: Physician Assistant

## 2022-05-02 DIAGNOSIS — M25571 Pain in right ankle and joints of right foot: Secondary | ICD-10-CM

## 2022-05-02 DIAGNOSIS — G8929 Other chronic pain: Secondary | ICD-10-CM | POA: Diagnosis not present

## 2022-05-02 DIAGNOSIS — M25561 Pain in right knee: Secondary | ICD-10-CM

## 2022-05-02 NOTE — Progress Notes (Signed)
x

## 2022-05-02 NOTE — Progress Notes (Signed)
Office Visit Note   Patient: Willodean Leven           Date of Birth: 09-22-2000           MRN: [id number] Visit Date: 05/02/2022              Requested by: No referring provider defined for this encounter. PCP: Patient, No Pcp Per  No chief complaint on file.     HPI: Patient is a pleasant 21 year old woman with a lifetime history of the right foot.  She says she thinks she has had this her entire life.  She does recall wearing "orthopedic "shoes she has never really had any treatment but as she has grown up she is beginning to have more pain that now goes in her knee and her hip.  It is affecting her everyday life.  She says when she does try to straighten her foot it is painful she denies any injuries denies any ankle sprains  Assessment & Plan: Visit Diagnoses:  1. Chronic pain of right knee   2. Pain in right ankle and joints of right foot     Plan: I discussed the patient with Dr. Garnette Czech by phone.  She does have significant intoeing with her gait and when turning the foot to neutral it is painful her for her.  She does have good plantarflexion dorsiflexion eversion inversion.  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  We will have her visit with Dr. Sharol Given to see if this is something that could be corrected with specific orthotics.  I am not sure physical therapy at this point would help her much.  Also could discuss any surgical options.  Follow-Up Instructions: No follow-ups on file.  Ortho Exam  Patient is alert, oriented, no adenopathy, well-dressed, normal affect, normal respiratory effort. Right foot: With ambulation she does in toe.  Creates a mild antalgic gait.  Her right foot there is no swelling no erythema she has a strong dorsalis pedis pulse.  She has good dorsiflexion plantarflexion inversion eversion.  She does have pain with eversion and bringing her foot to a neutral position. Right  knee she has no effusion no redness no erythema has some tenderness to palpation over the medial side of the joint.  Good varus valgus stability  Imaging: XR KNEE 3 VIEW RIGHT  Result Date: 05/02/2022 Three-view radiographs of her knee demonstrate well-preserved joint spaces.  No degenerative changes no acute osseous findings  XR Foot Complete Right  Result Date: 05/02/2022 Three-view radiographs of her foot were obtained today.  Well-maintained alignment.  No degenerative changes.  No acute osseous abnormalities  No images are attached to the encounter.  Labs: Lab Results  Component Value Date   HGBA1C 5.0 01/06/2018   REPTSTATUS 01/07/2018 FINAL 01/06/2018   CULT  01/06/2018    NO MRSA DETECTED Performed at [facility], [place] [address], [place], [place] [address]      Lab Results  Component Value Date   ALBUMIN 4.1 01/15/2022   ALBUMIN 3.5 01/07/2018   ALBUMIN 3.9 01/06/2018    Lab Results  Component Value Date   MG 1.5 (L) 01/07/2018   MG 1.7 01/06/2018   No results found for: "VD25OH"  No results found for: "PREALBUMIN"    Latest Ref Rng & Units 01/15/2022    8:39 PM 01/07/2018    5:19 AM 01/06/2018    5:11 AM  CBC EXTENDED  WBC 4.0 - 10.5 K/uL 5.5  8.6  6.7   RBC 3.87 - 5.11 MIL/uL 4.45  3.90  4.33   Hemoglobin 12.0 - 15.0 g/dL 13.3  11.7  13.0   HCT 36.0 - 46.0 % 39.8  35.6  38.6   Platelets 150 - 400 K/uL 266  243  278   NEUT# 1.7 - 7.7 K/uL 3.6  5.1  4.1   Lymph# 0.7 - 4.0 K/uL 1.3  2.5  1.8      There is no height or weight on file to calculate BMI.  Orders:  Orders Placed This Encounter  Procedures   XR Foot Complete Right   XR KNEE 3 VIEW RIGHT   No orders of the defined types were placed in this encounter.    Procedures: No procedures performed  Clinical Data: No additional findings.  ROS:  All other systems negative, except as noted in the HPI. Review of Systems  Objective: Vital Signs: There were no vitals taken for this  visit.  Specialty Comments:  No specialty comments available.  PMFS History: Patient Active Problem List   Diagnosis Date Noted   Acute on chronic cholecystitis s/p lap cholecystectomy 01/06/2018 01/07/2018   Choledocholithiasis s/p ERCP 01/05/2018 01/04/2018   Elevated LFTs 01/04/2018   Vaginal delivery 03/06/2016   History reviewed. No pertinent past medical history.  Family History  Problem Relation Age of Onset   Hypertension Mother     Past Surgical History:  Procedure Laterality Date   CHOLECYSTECTOMY N/A 01/06/2018   Procedure: LAPAROSCOPIC CHOLECYSTECTOMY;  Surgeon: Stark Klein, MD;  Location: [facility];  Service: General;  Laterality: N/A;   ERCP N/A 01/05/2018   Procedure: ENDOSCOPIC RETROGRADE CHOLANGIOPANCREATOGRAPHY (ERCP);  Surgeon: Ronnette Juniper, MD;  Location: [facility];  Service: Gastroenterology;  Laterality: N/A;   REMOVAL OF STONES  01/05/2018   Procedure: REMOVAL OF STONES;  Surgeon: Ronnette Juniper, MD;  Location: [facility];  Service: Gastroenterology;;   Joan Mayans  01/05/2018   Procedure: Joan Mayans;  Surgeon: Ronnette Juniper, MD;  Location: [facility];  Service: Gastroenterology;;   Social History   Occupational History   Not on file  Tobacco Use   Smoking status: Former   Smokeless tobacco: Former  Substance and Sexual Activity   Alcohol use: No   Drug use: No   Sexual activity: Yes    Birth control/protection: Implant

## 2022-05-09 ENCOUNTER — Ambulatory Visit: Payer: Medicaid Other | Admitting: Obstetrics and Gynecology

## 2022-05-10 ENCOUNTER — Ambulatory Visit (INDEPENDENT_AMBULATORY_CARE_PROVIDER_SITE_OTHER): Payer: Medicaid Other | Admitting: Orthopedic Surgery

## 2022-05-10 DIAGNOSIS — M25562 Pain in left knee: Secondary | ICD-10-CM

## 2022-05-10 DIAGNOSIS — G8929 Other chronic pain: Secondary | ICD-10-CM

## 2022-05-10 DIAGNOSIS — Q742 Other congenital malformations of lower limb(s), including pelvic girdle: Secondary | ICD-10-CM

## 2022-05-11 ENCOUNTER — Encounter: Payer: Self-pay | Admitting: Orthopedic Surgery

## 2022-05-11 NOTE — Progress Notes (Signed)
Office Visit Note   Patient: Jennifer Hunter           Date of Birth: 06/02/2001           MRN: [id number] Visit Date: 05/10/2022              Requested by: No referring provider defined for this encounter. 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  Patient states when she tries to externally rotate her foot for ambulation she develops patellofemoral pain.  Patient states she had orthotics when she was a child.  Assessment & Plan: Visit Diagnoses:  1. Congenital internal tibial torsion   2. Chronic pain of left knee     Plan: Recommended VMO strengthening, do not feel there is any surgical intervention necessary for the internal tibial torsion.  Follow-Up Instructions: Return if symptoms worsen or fail to improve.   Ortho Exam  Patient is alert, oriented, no adenopathy, well-dressed, normal affect, normal respiratory effort. Examination patient ambulates with internal rotation of the right lower extremity.  She has full range of motion of both knees and both hips and the range of motion is symmetric.  There is full range of motion of the ankle and subtalar joint.  Patient has lateral tilt and lateral tracking of the patella.  When she tries to externally rotate her foot this seems to exacerbate the tracking she is tender to palpation of the medial lateral facets of the patella.  She has VMO weakness.  Review of the radiographs shows slight lateral tracking of the patella on the right worse than the left.  Imaging: No results found. No images are attached to the encounter.  Labs: Lab Results  Component Value Date   HGBA1C 5.0 01/06/2018   REPTSTATUS 01/07/2018 FINAL 01/06/2018   CULT  01/06/2018    NO MRSA DETECTED Performed at [facility], [place] [address] [place], [place] [address]      Lab Results   Component Value Date   ALBUMIN 4.1 01/15/2022   ALBUMIN 3.5 01/07/2018   ALBUMIN 3.9 01/06/2018    Lab Results  Component Value Date   MG 1.5 (L) 01/07/2018   MG 1.7 01/06/2018   No results found for: "VD25OH"  No results found for: "PREALBUMIN"    Latest Ref Rng & Units 01/15/2022    8:39 PM 01/07/2018    5:19 AM 01/06/2018    5:11 AM  CBC EXTENDED  WBC 4.0 - 10.5 K/uL 5.5  8.6  6.7   RBC 3.87 - 5.11 MIL/uL 4.45  3.90  4.33   Hemoglobin 12.0 - 15.0 g/dL 13.3  11.7  13.0   HCT 36.0 - 46.0 % 39.8  35.6  38.6   Platelets 150 - 400 K/uL 266  243  278   NEUT# 1.7 - 7.7 K/uL 3.6  5.1  4.1   Lymph# 0.7 - 4.0 K/uL 1.3  2.5  1.8      There is no height or weight on file to calculate BMI.  Orders:  No orders of the defined types were placed in this encounter.  No orders of the defined types were placed in this encounter.    Procedures: No procedures performed  Clinical Data: No additional findings.  ROS:  All other systems negative, except as noted in the HPI. Review of  Systems  Objective: Vital Signs: There were no vitals taken for this visit.  Specialty Comments:  No specialty comments available.  PMFS History: Patient Active Problem List   Diagnosis Date Noted   Acute on chronic cholecystitis s/p lap cholecystectomy 01/06/2018 01/07/2018   Choledocholithiasis s/p ERCP 01/05/2018 01/04/2018   Elevated LFTs 01/04/2018   Vaginal delivery 03/06/2016   History reviewed. No pertinent past medical history.  Family History  Problem Relation Age of Onset   Hypertension Mother     Past Surgical History:  Procedure Laterality Date   CHOLECYSTECTOMY N/A 01/06/2018   Procedure: LAPAROSCOPIC CHOLECYSTECTOMY;  Surgeon: Stark Klein, MD;  Location: [facility];  Service: General;  Laterality: N/A;   ERCP N/A 01/05/2018   Procedure: ENDOSCOPIC RETROGRADE CHOLANGIOPANCREATOGRAPHY (ERCP);  Surgeon: Ronnette Juniper, MD;  Location: [facility];  Service: Gastroenterology;   Laterality: N/A;   REMOVAL OF STONES  01/05/2018   Procedure: REMOVAL OF STONES;  Surgeon: Ronnette Juniper, MD;  Location: [facility];  Service: Gastroenterology;;   Joan Mayans  01/05/2018   Procedure: Joan Mayans;  Surgeon: Ronnette Juniper, MD;  Location: [facility];  Service: Gastroenterology;;   Social History   Occupational History   Not on file  Tobacco Use   Smoking status: Former   Smokeless tobacco: Former  Substance and Sexual Activity   Alcohol use: No   Drug use: No   Sexual activity: Yes    Birth control/protection: Implant
